# Patient Record
Sex: Female | Born: 1960 | Race: Black or African American | Hispanic: No | Marital: Single | Smoking: Never smoker
Health system: Southern US, Community
[De-identification: ages and names within clinical notes are randomized; demographics above are authoritative.]

## PROBLEM LIST (undated history)

## (undated) DIAGNOSIS — H35039 Hypertensive retinopathy, unspecified eye: Secondary | ICD-10-CM

## (undated) DIAGNOSIS — H269 Unspecified cataract: Secondary | ICD-10-CM

## (undated) DIAGNOSIS — I10 Essential (primary) hypertension: Secondary | ICD-10-CM

## (undated) DIAGNOSIS — E11319 Type 2 diabetes mellitus with unspecified diabetic retinopathy without macular edema: Secondary | ICD-10-CM

## (undated) HISTORY — DX: Unspecified cataract: H26.9

## (undated) HISTORY — PX: TUBAL LIGATION: SHX77

## (undated) HISTORY — PX: EYE SURGERY: SHX253

## (undated) HISTORY — DX: Hypertensive retinopathy, unspecified eye: H35.039

## (undated) HISTORY — DX: Type 2 diabetes mellitus with unspecified diabetic retinopathy without macular edema: E11.319

---

## 1999-09-04 ENCOUNTER — Encounter: Payer: Self-pay | Admitting: Emergency Medicine

## 1999-09-04 ENCOUNTER — Emergency Department (HOSPITAL_COMMUNITY): Admission: EM | Admit: 1999-09-04 | Discharge: 1999-09-04 | Payer: Self-pay | Admitting: Emergency Medicine

## 1999-10-22 ENCOUNTER — Emergency Department (HOSPITAL_COMMUNITY): Admission: EM | Admit: 1999-10-22 | Discharge: 1999-10-22 | Payer: Self-pay | Admitting: Emergency Medicine

## 2000-04-05 ENCOUNTER — Ambulatory Visit (HOSPITAL_COMMUNITY): Admission: RE | Admit: 2000-04-05 | Discharge: 2000-04-05 | Payer: Self-pay | Admitting: *Deleted

## 2000-11-10 ENCOUNTER — Emergency Department (HOSPITAL_COMMUNITY): Admission: EM | Admit: 2000-11-10 | Discharge: 2000-11-10 | Payer: Self-pay | Admitting: Emergency Medicine

## 2001-12-15 ENCOUNTER — Encounter: Payer: Self-pay | Admitting: Emergency Medicine

## 2001-12-15 ENCOUNTER — Emergency Department (HOSPITAL_COMMUNITY): Admission: EM | Admit: 2001-12-15 | Discharge: 2001-12-15 | Payer: Self-pay | Admitting: Emergency Medicine

## 2002-04-24 ENCOUNTER — Ambulatory Visit (HOSPITAL_COMMUNITY): Admission: RE | Admit: 2002-04-24 | Discharge: 2002-04-24 | Payer: Self-pay | Admitting: Obstetrics

## 2002-04-24 ENCOUNTER — Encounter: Payer: Self-pay | Admitting: Obstetrics

## 2003-04-23 ENCOUNTER — Encounter: Payer: Self-pay | Admitting: Obstetrics

## 2003-04-23 ENCOUNTER — Ambulatory Visit (HOSPITAL_COMMUNITY): Admission: RE | Admit: 2003-04-23 | Discharge: 2003-04-23 | Payer: Self-pay | Admitting: Obstetrics

## 2004-05-16 ENCOUNTER — Ambulatory Visit (HOSPITAL_COMMUNITY): Admission: RE | Admit: 2004-05-16 | Discharge: 2004-05-16 | Payer: Self-pay | Admitting: Obstetrics

## 2005-01-15 ENCOUNTER — Emergency Department (HOSPITAL_COMMUNITY): Admission: EM | Admit: 2005-01-15 | Discharge: 2005-01-15 | Payer: Self-pay | Admitting: Emergency Medicine

## 2005-05-17 ENCOUNTER — Ambulatory Visit (HOSPITAL_COMMUNITY): Admission: RE | Admit: 2005-05-17 | Discharge: 2005-05-17 | Payer: Self-pay | Admitting: Obstetrics

## 2005-08-16 ENCOUNTER — Ambulatory Visit (HOSPITAL_COMMUNITY): Admission: RE | Admit: 2005-08-16 | Discharge: 2005-08-16 | Payer: Self-pay | Admitting: Internal Medicine

## 2006-01-14 ENCOUNTER — Emergency Department (HOSPITAL_COMMUNITY): Admission: EM | Admit: 2006-01-14 | Discharge: 2006-01-14 | Payer: Self-pay | Admitting: Emergency Medicine

## 2006-01-18 ENCOUNTER — Encounter: Admission: RE | Admit: 2006-01-18 | Discharge: 2006-01-18 | Payer: Self-pay | Admitting: Orthopedic Surgery

## 2006-05-18 ENCOUNTER — Ambulatory Visit (HOSPITAL_COMMUNITY): Admission: RE | Admit: 2006-05-18 | Discharge: 2006-05-18 | Payer: Self-pay | Admitting: Obstetrics

## 2007-05-20 ENCOUNTER — Ambulatory Visit (HOSPITAL_COMMUNITY): Admission: RE | Admit: 2007-05-20 | Discharge: 2007-05-20 | Payer: Self-pay | Admitting: Obstetrics

## 2008-06-09 ENCOUNTER — Ambulatory Visit (HOSPITAL_COMMUNITY): Admission: RE | Admit: 2008-06-09 | Discharge: 2008-06-09 | Payer: Self-pay | Admitting: Obstetrics

## 2008-11-14 ENCOUNTER — Emergency Department (HOSPITAL_COMMUNITY): Admission: EM | Admit: 2008-11-14 | Discharge: 2008-11-14 | Payer: Self-pay | Admitting: Emergency Medicine

## 2009-06-11 ENCOUNTER — Ambulatory Visit (HOSPITAL_COMMUNITY): Admission: RE | Admit: 2009-06-11 | Discharge: 2009-06-11 | Payer: Self-pay | Admitting: Obstetrics

## 2010-06-14 ENCOUNTER — Ambulatory Visit (HOSPITAL_COMMUNITY): Admission: RE | Admit: 2010-06-14 | Discharge: 2010-06-14 | Payer: Self-pay | Admitting: Internal Medicine

## 2011-02-24 NOTE — Op Note (Signed)
[facility] of [facility]  Patient:    Jenny Cook, Jenny Cook                    MRN: [id number] Proc. Date: 04/05/00 Adm. Date:  [id number] Attending:  Deniece Ree                           Operative Report  PREOPERATIVE DIAGNOSIS:       Multiparity, desires permanent sterilization.  POSTOPERATIVE DIAGNOSIS:      Multiparity, desires permanent sterilization.  OPERATION:                    Laparoscopic bilateral tubal cauterization with tubal resection.  SURGEON:                      Deniece Ree, M.D.  ANESTHESIA:                   General.  ESTIMATED BLOOD LOSS:         Less than 25 cc.  COMPLICATIONS:                None.  DISPOSITION:                  The patient tolerated the procedure well and returned to the recovery room in satisfactory condition.  DESCRIPTION OF PROCEDURE:     The patient was taken to the operating room, prepped and draped in the usual fashion for sterilization procedure.  A speculum was placed in the vagina, following which the anterior lip of the cervix was then grasped with a Christella Hartigan tenaculum.  A subumbilical incision was made, following which the Veress needle was introduced through this incision and approximately 4 L of carbon dioxide was then infused without difficulty. The laparoscopic trocar was then placed through this incision, following which the laparoscope was placed through the sleeve.  Visualization of the pelvic organs came into view.  There was a moderate amount of adhesions which were apparently from her previous cesarean sections.  The right tube was identified.  It was then cauterized approximately 5 mm proximal to the right cornu and cauterized approximately up 4 cm in length.  This was done likewise on the left side.  At this point, the cauterized segments of tube bilaterally were then cut in three locations.  Hemostasis remained present.  Sponge and needle count was correct x 2.  The carbon dioxide was  allowed to escape from the abdominal cavity without problems.  The incision was then closed, first with a deep interrupted stitch, followed by a subcuticular stitch using 4-0 Vicryl.  The procedure was terminated.  The patient tolerated the procedure well and returned to the recovery room in satisfactory condition.  The patient will be discharged when fully alert.  She has been instructed on the possible complications and care for the site of surgery.  She has been told to return to my office in four weeks for follow up evaluation or to call me prior to that time, should a problem arise. DD:  04/05/00 TD:  04/06/00 Job: [id number]

## 2011-02-24 NOTE — H&P (Signed)
[facility] of [facility]  Patient:    Jenny Cook, Jenny Cook                    MRN: [id number] Adm. Date:  [id number] Attending:  Deniece Ree                         History and Physical  HISTORY:                      The patient is a 50 year old multiparous female who is desirous of permanent sterilization.  The patient understands the different types of contraceptives available.  However, she has decided to proceed with permanent sterilization.  She understands that this procedure is intended to be permanent.  However, cannot be guaranteed.  All of her questions were answered.  The possible complicates were explained to the patient, who understands and accepts.  PAST MEDICAL HISTORY AND REVIEW OF SYSTEMS:        Significant in that the patient did have cesarean section x 2.  PHYSICAL EXAMINATION:  GENERAL:                      Well-developed, well-nourished obese female in no acute distress.  HEENT:                        Within normal limits.  NECK:                         Supple.  BREASTS:                      Without masses, tenderness or discharge.  LUNGS:                        Clear to auscultation and percussion.  HEART:                        Normal sinus rhythm without murmurs, rubs, or gallops.  ABDOMEN:                      Obese and benign.  EXTREMITIES:                  Within normal limits.  NEUROLOGIC:                   Within normal limits.  PELVIC:                       External genitalia, BUS within normal limits. the vagina is clear.  The uterus is normal size, shape and consistency.  The adnexa are benign.  DIAGNOSIS:                    Multiparity, desires permanent sterilization.  PLAN:                         Laparoscopic bilateral tubal cauterization with tubal resection. DD:  04/05/00 TD:  04/06/00 Job: [id number]

## 2011-06-05 ENCOUNTER — Other Ambulatory Visit (HOSPITAL_COMMUNITY): Payer: Self-pay | Admitting: Obstetrics

## 2011-06-05 DIAGNOSIS — Z1231 Encounter for screening mammogram for malignant neoplasm of breast: Secondary | ICD-10-CM

## 2011-06-16 ENCOUNTER — Ambulatory Visit (HOSPITAL_COMMUNITY)
Admission: RE | Admit: 2011-06-16 | Discharge: 2011-06-16 | Disposition: A | Discharge status: Home / Self Care | Payer: BC Managed Care – PPO | Source: Ambulatory Visit | Attending: Obstetrics | Admitting: Obstetrics

## 2011-06-16 DIAGNOSIS — Z1231 Encounter for screening mammogram for malignant neoplasm of breast: Secondary | ICD-10-CM

## 2011-12-14 ENCOUNTER — Encounter (HOSPITAL_COMMUNITY): Payer: Self-pay | Admitting: Emergency Medicine

## 2011-12-14 ENCOUNTER — Emergency Department (INDEPENDENT_AMBULATORY_CARE_PROVIDER_SITE_OTHER)
Admission: EM | Admit: 2011-12-14 | Discharge: 2011-12-14 | Disposition: A | Discharge status: Home / Self Care | Payer: Self-pay | Source: Home / Self Care

## 2011-12-14 DIAGNOSIS — S161XXA Strain of muscle, fascia and tendon at neck level, initial encounter: Secondary | ICD-10-CM

## 2011-12-14 DIAGNOSIS — S139XXA Sprain of joints and ligaments of unspecified parts of neck, initial encounter: Secondary | ICD-10-CM

## 2011-12-14 HISTORY — DX: Essential (primary) hypertension: I10

## 2011-12-14 MED ORDER — DICLOFENAC SODIUM 75 MG PO TBEC: 75.0000 mg | DELAYED_RELEASE_TABLET | Freq: Two times a day (BID) | ORAL | Status: DC

## 2011-12-14 MED ORDER — TRAMADOL HCL 50 MG PO TABS: 50.0000 mg | ORAL_TABLET | Freq: Four times a day (QID) | ORAL | Status: AC | PRN

## 2011-12-14 MED ORDER — METHOCARBAMOL 750 MG PO TABS: 750.0000 mg | ORAL_TABLET | Freq: Four times a day (QID) | ORAL | Status: AC
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
## 2011-12-14 NOTE — ED Notes (Signed)
Report to marie, rn
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
## 2011-12-14 NOTE — Discharge Instructions (Signed)
Stop taking Aleve. Warm compresses to neck and upper back area 3-4 times a day for 15-20 minutes, or more often as needed. If your symptoms are not improving next week followup with Dr. Chestine Spore. Return sooner if symptoms change or worsen.

## 2011-12-15 NOTE — ED Provider Notes (Signed)
Medical screening examination/treatment/procedure(s) were performed by non-physician practitioner and as supervising physician I was immediately available for consultation/collaboration.  Fusaye Wachtel M. MD   Daisee Centner M Abel Hageman, MD 12/15/11 0826 

## 2012-01-02 ENCOUNTER — Emergency Department (HOSPITAL_COMMUNITY): Payer: No Typology Code available for payment source

## 2012-01-02 ENCOUNTER — Emergency Department (HOSPITAL_COMMUNITY)
Admission: EM | Admit: 2012-01-02 | Discharge: 2012-01-02 | Disposition: A | Discharge status: Home / Self Care | Payer: No Typology Code available for payment source | Attending: Emergency Medicine | Admitting: Emergency Medicine

## 2012-01-02 ENCOUNTER — Encounter (HOSPITAL_COMMUNITY): Payer: Self-pay

## 2012-01-02 DIAGNOSIS — M542 Cervicalgia: Secondary | ICD-10-CM | POA: Insufficient documentation

## 2012-01-02 DIAGNOSIS — E119 Type 2 diabetes mellitus without complications: Secondary | ICD-10-CM | POA: Insufficient documentation

## 2012-01-02 DIAGNOSIS — S161XXA Strain of muscle, fascia and tendon at neck level, initial encounter: Secondary | ICD-10-CM

## 2012-01-02 DIAGNOSIS — I10 Essential (primary) hypertension: Secondary | ICD-10-CM | POA: Insufficient documentation

## 2012-01-02 DIAGNOSIS — M545 Low back pain, unspecified: Secondary | ICD-10-CM | POA: Insufficient documentation

## 2012-01-02 DIAGNOSIS — S139XXA Sprain of joints and ligaments of unspecified parts of neck, initial encounter: Secondary | ICD-10-CM | POA: Insufficient documentation

## 2012-01-02 DIAGNOSIS — M546 Pain in thoracic spine: Secondary | ICD-10-CM | POA: Insufficient documentation

## 2012-01-02 LAB — GLUCOSE, CAPILLARY: Glucose-Capillary: 229 mg/dL — ABNORMAL HIGH (ref 70–99)

## 2012-01-02 MED ORDER — OXYCODONE-ACETAMINOPHEN 5-325 MG PO TABS: 1.0000 | ORAL_TABLET | Freq: Four times a day (QID) | ORAL | Status: AC | PRN

## 2012-01-02 MED ORDER — CYCLOBENZAPRINE HCL 5 MG PO TABS: 5.0000 mg | ORAL_TABLET | Freq: Three times a day (TID) | ORAL | Status: AC | PRN

## 2012-01-02 MED ORDER — OXYCODONE-ACETAMINOPHEN 5-325 MG PO TABS
1.0000 | ORAL_TABLET | Freq: Once | ORAL | Status: AC
  Administered 2012-01-02: 1 via ORAL
  Filled 2012-01-02: qty 1
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
## 2012-01-02 NOTE — ED Notes (Signed)
Patient reports she thinks her blood sugar is dropping, CBG checked and was 229
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
## 2012-01-02 NOTE — Discharge Instructions (Signed)
Cervical Sprain  A cervical sprain is an injury in the neck in which the ligaments are stretched or torn. The ligaments are the tissues that hold the bones of the neck (vertebrae) in place. Cervical sprains can range from very mild to very severe. Most cervical sprains get better in 1 to 3 weeks, but it depends on the cause and extent of the injury. Severe cervical sprains can cause the neck vertebrae to be unstable. This can lead to damage of the spinal cord and can result in serious nervous system problems. Your caregiver will determine whether your cervical sprain is mild or severe.  CAUSES   Severe cervical sprains may be caused by:  · Contact sport injuries (football, rugby, wrestling, hockey, auto racing, gymnastics, diving, martial arts, boxing).  · Motor vehicle collisions.  · Whiplash injuries. This means the neck is forcefully whipped backward and forward.  · Falls.  Mild cervical sprains may be caused by:   · Awkward positions, such as cradling a telephone between your ear and shoulder.  · Sitting in a chair that does not offer proper support.  · Working at a poorly designed computer station.  · Activities that require looking up or down for long periods of time.  SYMPTOMS   · Pain, soreness, stiffness, or a burning sensation in the front, back, or sides of the neck. This discomfort may develop immediately after injury or it may develop slowly and not begin for 24 hours or more after an injury.  · Pain or tenderness directly in the middle of the back of the neck.  · Shoulder or upper back pain.  · Limited ability to move the neck.  · Headache.  · Dizziness.  · Weakness, numbness, or tingling in the hands or arms.  · Muscle spasms.  · Difficulty swallowing or chewing.  · Tenderness and swelling of the neck.  DIAGNOSIS   Most of the time, your caregiver can diagnose this problem by taking your history and doing a physical exam. Your caregiver will ask about any known problems, such as arthritis in the neck  or a previous neck injury. X-rays may be taken to find out if there are any other problems, such as problems with the bones of the neck. However, an X-ray often does not reveal the full extent of a cervical sprain. Other tests such as a computed tomography (CT) scan or magnetic resonance imaging (MRI) may be needed.  TREATMENT   Treatment depends on the severity of the cervical sprain. Mild sprains can be treated with rest, keeping the neck in place (immobilization), and pain medicines. Severe cervical sprains need immediate immobilization and an appointment with an orthopedist or neurosurgeon. Several treatment options are available to help with pain, muscle spasms, and other symptoms. Your caregiver may prescribe:  · Medicines, such as pain relievers, numbing medicines, or muscle relaxants.  · Physical therapy. This can include stretching exercises, strengthening exercises, and posture training. Exercises and improved posture can help stabilize the neck, strengthen muscles, and help stop symptoms from returning.  · A neck collar to be worn for short periods of time. Often, these collars are worn for comfort. However, certain collars may be worn to protect the neck and prevent further worsening of a serious cervical sprain.  HOME CARE INSTRUCTIONS   · Put ice on the injured area.  · Put ice in a plastic bag.  · Place a towel between your skin and the bag.  · Leave the ice on for 15   to 20 minutes, 3 to 4 times a day.  · Only take over-the-counter or prescription medicines for pain, discomfort, or fever as directed by your caregiver.  · Keep all follow-up appointments as directed by your caregiver.  · Keep all physical therapy appointments as directed by your caregiver.  · If a neck collar is prescribed, wear it as directed by your caregiver.  · Do not drive while wearing a neck collar.  · Make any needed adjustments to your work station to promote good posture.  · Avoid positions and activities that make your  symptoms worse.  · Warm up and stretch before being active to help prevent problems.  SEEK MEDICAL CARE IF:   · Your pain is not controlled with medicine.  · You are unable to decrease your pain medicine over time as planned.  · Your activity level is not improving as expected.  SEEK IMMEDIATE MEDICAL CARE IF:   · You develop any bleeding, stomach upset, or signs of an allergic reaction to your medicine.  · Your symptoms get worse.  · You develop new, unexplained symptoms.  · You have numbness, tingling, weakness, or paralysis in any part of your body.  MAKE SURE YOU:   · Understand these instructions.  · Will watch your condition.  · Will get help right away if you are not doing well or get worse.  Document Released: 07/23/2007 Document Revised: 09/14/2011 Document Reviewed: 06/28/2011  ExitCare® Patient Information ©2012 [employer].  Motor Vehicle Collision   It is common to have multiple bruises and sore muscles after a motor vehicle collision (MVC). These tend to feel worse for the first 24 hours. You may have the most stiffness and soreness over the first several hours. You may also feel worse when you wake up the first morning after your collision. After this point, you will usually begin to improve with each day. The speed of improvement often depends on the severity of the collision, the number of injuries, and the location and nature of these injuries.  HOME CARE INSTRUCTIONS   · Put ice on the injured area.  · Put ice in a plastic bag.  · Place a towel between your skin and the bag.  · Leave the ice on for 15 to 20 minutes, 3 to 4 times a day.  · Drink enough fluids to keep your urine clear or pale yellow. Do not drink alcohol.  · Take a warm shower or bath once or twice a day. This will increase blood flow to sore muscles.  · You may return to activities as directed by your caregiver. Be careful when lifting, as this may aggravate neck or back pain.  · Only take over-the-counter or prescription  medicines for pain, discomfort, or fever as directed by your caregiver. Do not use aspirin. This may increase bruising and bleeding.  SEEK IMMEDIATE MEDICAL CARE IF:  · You have numbness, tingling, or weakness in the arms or legs.  · You develop severe headaches not relieved with medicine.  · You have severe neck pain, especially tenderness in the middle of the back of your neck.  · You have changes in bowel or bladder control.  · There is increasing pain in any area of the body.  · You have shortness of breath, lightheadedness, dizziness, or fainting.  · You have chest pain.  · You feel sick to your stomach (nauseous), throw up (vomit), or sweat.  · You have increasing abdominal discomfort.  · There   is blood in your urine, stool, or vomit.  · You have pain in your shoulder (shoulder strap areas).  · You feel your symptoms are getting worse.  MAKE SURE YOU:   · Understand these instructions.  · Will watch your condition.  · Will get help right away if you are not doing well or get worse.  Document Released: 09/25/2005 Document Revised: 09/14/2011 Document Reviewed: 02/22/2011  ExitCare® Patient Information ©2012 [employer].

## 2012-07-01 ENCOUNTER — Other Ambulatory Visit (HOSPITAL_COMMUNITY): Payer: Self-pay | Admitting: Obstetrics

## 2012-07-01 DIAGNOSIS — Z1231 Encounter for screening mammogram for malignant neoplasm of breast: Secondary | ICD-10-CM

## 2012-07-16 ENCOUNTER — Ambulatory Visit (HOSPITAL_COMMUNITY)
Admission: RE | Admit: 2012-07-16 | Discharge: 2012-07-16 | Disposition: A | Discharge status: Home / Self Care | Payer: BC Managed Care – PPO | Source: Ambulatory Visit | Attending: Obstetrics | Admitting: Obstetrics

## 2012-07-16 DIAGNOSIS — Z1231 Encounter for screening mammogram for malignant neoplasm of breast: Secondary | ICD-10-CM | POA: Insufficient documentation

## 2012-07-18 ENCOUNTER — Other Ambulatory Visit: Payer: Self-pay | Admitting: Obstetrics

## 2012-07-18 DIAGNOSIS — R928 Other abnormal and inconclusive findings on diagnostic imaging of breast: Secondary | ICD-10-CM

## 2012-07-23 ENCOUNTER — Ambulatory Visit
Admission: RE | Admit: 2012-07-23 | Discharge: 2012-07-23 | Disposition: A | Discharge status: Home / Self Care | Payer: BC Managed Care – PPO | Source: Ambulatory Visit | Attending: Obstetrics | Admitting: Obstetrics

## 2012-07-23 DIAGNOSIS — R928 Other abnormal and inconclusive findings on diagnostic imaging of breast: Secondary | ICD-10-CM

## 2012-09-18 ENCOUNTER — Other Ambulatory Visit (HOSPITAL_COMMUNITY): Payer: Self-pay | Admitting: Obstetrics

## 2012-09-18 DIAGNOSIS — Z1273 Encounter for screening for malignant neoplasm of ovary: Secondary | ICD-10-CM

## 2012-09-26 ENCOUNTER — Ambulatory Visit (HOSPITAL_COMMUNITY)
Admission: RE | Admit: 2012-09-26 | Discharge: 2012-09-26 | Disposition: A | Discharge status: Home / Self Care | Payer: BC Managed Care – PPO | Source: Ambulatory Visit | Attending: Obstetrics | Admitting: Obstetrics

## 2012-09-26 DIAGNOSIS — E119 Type 2 diabetes mellitus without complications: Secondary | ICD-10-CM | POA: Insufficient documentation

## 2012-09-26 DIAGNOSIS — Z1273 Encounter for screening for malignant neoplasm of ovary: Secondary | ICD-10-CM

## 2012-09-26 DIAGNOSIS — N838 Other noninflammatory disorders of ovary, fallopian tube and broad ligament: Secondary | ICD-10-CM | POA: Insufficient documentation

## 2012-09-26 DIAGNOSIS — Z1289 Encounter for screening for malignant neoplasm of other sites: Secondary | ICD-10-CM | POA: Insufficient documentation

## 2012-09-26 DIAGNOSIS — N859 Noninflammatory disorder of uterus, unspecified: Secondary | ICD-10-CM | POA: Insufficient documentation

## 2012-09-26 DIAGNOSIS — Z01411 Encounter for gynecological examination (general) (routine) with abnormal findings: Secondary | ICD-10-CM

## 2012-09-26 DIAGNOSIS — I10 Essential (primary) hypertension: Secondary | ICD-10-CM | POA: Insufficient documentation

## 2012-09-26 DIAGNOSIS — D259 Leiomyoma of uterus, unspecified: Secondary | ICD-10-CM | POA: Insufficient documentation

## 2012-12-25 ENCOUNTER — Other Ambulatory Visit: Payer: Self-pay | Admitting: Obstetrics

## 2013-01-08 ENCOUNTER — Ambulatory Visit
Admission: RE | Admit: 2013-01-08 | Discharge: 2013-01-08 | Disposition: A | Discharge status: Home / Self Care | Payer: BC Managed Care – PPO | Source: Ambulatory Visit | Attending: Obstetrics | Admitting: Obstetrics

## 2013-01-08 DIAGNOSIS — N63 Unspecified lump in unspecified breast: Secondary | ICD-10-CM

## 2013-08-25 ENCOUNTER — Other Ambulatory Visit (HOSPITAL_COMMUNITY): Payer: Self-pay | Admitting: Obstetrics

## 2013-08-25 DIAGNOSIS — Z0000 Encounter for general adult medical examination without abnormal findings: Secondary | ICD-10-CM

## 2013-09-01 ENCOUNTER — Other Ambulatory Visit: Payer: Self-pay | Admitting: Obstetrics

## 2013-09-01 ENCOUNTER — Ambulatory Visit (HOSPITAL_COMMUNITY)
Admission: RE | Admit: 2013-09-01 | Discharge: 2013-09-01 | Disposition: A | Discharge status: Home / Self Care | Payer: BC Managed Care – PPO | Source: Ambulatory Visit | Attending: Obstetrics | Admitting: Obstetrics

## 2013-09-01 DIAGNOSIS — N6320 Unspecified lump in the left breast, unspecified quadrant: Secondary | ICD-10-CM

## 2013-09-01 DIAGNOSIS — Z0000 Encounter for general adult medical examination without abnormal findings: Secondary | ICD-10-CM

## 2013-09-15 ENCOUNTER — Ambulatory Visit
Admission: RE | Admit: 2013-09-15 | Discharge: 2013-09-15 | Disposition: A | Discharge status: Home / Self Care | Payer: BC Managed Care – PPO | Source: Ambulatory Visit | Attending: Obstetrics | Admitting: Obstetrics

## 2013-09-15 DIAGNOSIS — N6320 Unspecified lump in the left breast, unspecified quadrant: Secondary | ICD-10-CM

## 2013-09-26 ENCOUNTER — Emergency Department (HOSPITAL_COMMUNITY)
Admission: EM | Admit: 2013-09-26 | Discharge: 2013-09-26 | Disposition: A | Discharge status: Home / Self Care | Payer: BC Managed Care – PPO | Source: Home / Self Care

## 2013-09-26 ENCOUNTER — Encounter (HOSPITAL_COMMUNITY): Payer: Self-pay | Admitting: Emergency Medicine

## 2013-09-26 DIAGNOSIS — R079 Chest pain, unspecified: Secondary | ICD-10-CM

## 2013-09-26 DIAGNOSIS — R0781 Pleurodynia: Secondary | ICD-10-CM

## 2013-09-26 DIAGNOSIS — R05 Cough: Secondary | ICD-10-CM

## 2013-09-26 DIAGNOSIS — J069 Acute upper respiratory infection, unspecified: Secondary | ICD-10-CM

## 2013-09-26 MED ORDER — PHENYLEPHRINE-CHLORPHEN-DM 10-4-12.5 MG/5ML PO LIQD: 5.0000 mL | ORAL | Status: DC | PRN

## 2013-09-26 NOTE — ED Provider Notes (Signed)
Medical screening examination/treatment/procedure(s) were performed by resident physician or non-physician practitioner and as supervising physician I was immediately available for consultation/collaboration.   Barkley Bruns MD.   Linna Hoff, MD 09/26/13 [phone]

## 2013-09-26 NOTE — ED Notes (Signed)
C/o rib pain States does have cough  States OTC cough medication and two zithromax was taken on thursday

## 2013-09-26 NOTE — ED Provider Notes (Signed)
CSN: [id number]     Arrival date & time 09/26/13  4098 History   First MD Initiated Contact with Patient 09/26/13 1051     Chief Complaint  Patient presents with  . Chest Pain   (Consider location/radiation/quality/duration/timing/severity/associated sxs/prior Treatment) HPI Comments: No dyspnea, fever, chest pain or tachycardia. Relaxed posturing ; normal ,even respirations.  Patient is a 52 y.o. female presenting with URI.  URI Presenting symptoms: congestion, cough and rhinorrhea   Presenting symptoms: no ear pain, no facial pain, no fever and no sore throat   Severity:  Mild Onset quality:  Gradual Duration:  3 days Timing:  Constant Progression:  Unchanged Chronicity:  New Relieved by:  Nothing Ineffective treatments:  OTC medications Associated symptoms: sneezing   Associated symptoms: no arthralgias, no headaches, no myalgias, no neck pain, no sinus pain, no swollen glands and no wheezing   Risk factors: diabetes mellitus   Risk factors: no recent illness     Past Medical History  Diagnosis Date  . Hypertension   . Diabetes mellitus    Past Surgical History  Procedure Laterality Date  . Tubal ligation     History reviewed. No pertinent family history. History  Substance Use Topics  . Smoking status: Never Smoker   . Smokeless tobacco: Not on file  . Alcohol Use: No   OB History   Grav Para Term Preterm Abortions TAB SAB Ect Mult Living                 Review of Systems  Constitutional: Negative for fever, chills, diaphoresis and activity change.  HENT: Positive for congestion, rhinorrhea and sneezing. Negative for ear pain and sore throat.   Respiratory: Positive for cough. Negative for shortness of breath and wheezing.   Cardiovascular: Negative for chest pain.  Genitourinary: Negative.   Musculoskeletal: Negative for arthralgias, myalgias and neck pain.       L lateral lower most rib pain associated with cough only. Better today.  Skin: Negative for  color change and rash.  Neurological: Negative for tremors, numbness and headaches.    Allergies  Review of patient's allergies indicates no known allergies.  Home Medications   Current Outpatient Rx  Name  Route  Sig  Dispense  Refill  . amLODipine (NORVASC) 10 MG tablet   Oral   Take 10 mg by mouth every morning.         . Insulin Isophane & Regular (HUMULIN 70/30 [place])   Subcutaneous   Inject 30-60 Units into the skin 2 (two) times daily. Sliding scale as directed. Pt normally takes 40 units in the am & 60 units in the pm.         . latanoprost (XALATAN) 0.005 % ophthalmic solution   Both Eyes   Place 1 drop into both eyes at bedtime.         Marland Kitchen losartan (COZAAR) 100 MG tablet   Oral   Take 100 mg by mouth every morning.         Marland Kitchen Phenylephrine-Chlorphen-DM 07-13-11.5 MG/5ML LIQD   Oral   Take 5 mLs by mouth every 4 (four) hours as needed.   120 mL   0    BP 190/106  Pulse 95  Temp(Src) 98.2 F (36.8 C) (Oral)  Resp 18  SpO2 97%  LMP 08/23/2013 Physical Exam  Nursing note and vitals reviewed. Constitutional: She is oriented to person, place, and time. She appears well-developed and well-nourished. No distress.  HENT:  Mouth/Throat: No oropharyngeal  exudate.  TM's nl OP with clear PND adn minor erythema.  Eyes: Conjunctivae and EOM are normal.  Neck: Normal range of motion. Neck supple.  Cardiovascular: Normal rate, regular rhythm and normal heart sounds.   Pulmonary/Chest: Effort normal and breath sounds normal. No respiratory distress. She has no wheezes. She has no rales.  Minor tenderness L lower lateral costal margin.  Musculoskeletal: Normal range of motion. She exhibits no edema.  Lymphadenopathy:    She has no cervical adenopathy.  Neurological: She is alert and oriented to person, place, and time.  Skin: Skin is warm and dry. No rash noted.  Psychiatric: She has a normal mood and affect.    ED Course  Procedures (including critical care  time) Labs Review Labs Reviewed - No data to display Imaging Review No results found.    MDM   1. Cough   2. URI (upper respiratory infection)   3. Rib pain on left side     Norel CS Plenty of fluids F/U wit your PCP    Hayden Rasmussen, NP 09/26/13 1147

## 2014-02-18 ENCOUNTER — Ambulatory Visit (INDEPENDENT_AMBULATORY_CARE_PROVIDER_SITE_OTHER): Payer: BC Managed Care – PPO

## 2014-02-18 ENCOUNTER — Ambulatory Visit (INDEPENDENT_AMBULATORY_CARE_PROVIDER_SITE_OTHER): Payer: BC Managed Care – PPO | Admitting: Podiatry

## 2014-02-18 VITALS — BP 140/73 | HR 80 | Resp 16

## 2014-02-18 DIAGNOSIS — M7660 Achilles tendinitis, unspecified leg: Secondary | ICD-10-CM

## 2014-02-18 DIAGNOSIS — E1149 Type 2 diabetes mellitus with other diabetic neurological complication: Secondary | ICD-10-CM

## 2014-02-18 DIAGNOSIS — R52 Pain, unspecified: Secondary | ICD-10-CM

## 2014-02-18 DIAGNOSIS — M79609 Pain in unspecified limb: Secondary | ICD-10-CM

## 2014-02-18 DIAGNOSIS — B351 Tinea unguium: Secondary | ICD-10-CM

## 2014-02-18 MED ORDER — TRIAMCINOLONE ACETONIDE 10 MG/ML IJ SUSP
10.0000 mg | Freq: Once | INTRAMUSCULAR | Status: AC
  Administered 2014-02-18: 10 mg

## 2014-02-18 NOTE — Patient Instructions (Signed)

## 2014-02-18 NOTE — Progress Notes (Signed)
Subjective:     Patient ID: Jenny Cook, female   DOB: 06-06-1961, 53 y.o.   MRN: [id number]  HPI patient presents with pain in the back of both heels pain in her nailbeds 1-5 both feet and a lesion right foot. 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 Keratotic lesion sub-fourth metatarsal right and I noted that there is significant discomfort in the posterior medial aspect of the heel region right over left    Assessment:     Achilles tendinitis mycotic nail infection with pain in keratotic lesion right    Plan:     H&P and x-rays reviewed. Discussed careful injection of the right posterior heel explaining chances for rupture and changes to her blood sugar. Patient wants procedure and today I did a careful medial injection 3 mg dexamethasone Kenalog 5 mg Xylocaine and advised him reduced activity for several days. Debridement nailbeds 1-5 both feet noted bleeding noted and lesion on plantar aspect right foot reappoint 4 weeks for consideration of injection left foot

## 2014-02-18 NOTE — Progress Notes (Signed)
   Subjective:    Patient ID: Jenny Cook, female    DOB: [birth year], 53 y.o.   MRN: [id number]  HPI Pt presents with bilateral foot pain located on backs of heels/achilles area. She also c/o right plantar foot pain. Requested nail trim. Pain is managed with Rx pain medication and is effective. No treatment other than pain medication.   Review of Systems     Objective:   Physical Exam        Assessment & Plan:

## 2014-03-18 ENCOUNTER — Ambulatory Visit (INDEPENDENT_AMBULATORY_CARE_PROVIDER_SITE_OTHER): Payer: BC Managed Care – PPO | Admitting: Podiatry

## 2014-03-18 ENCOUNTER — Encounter: Payer: Self-pay | Admitting: Podiatry

## 2014-03-18 VITALS — BP 135/82 | HR 75 | Resp 16

## 2014-03-18 DIAGNOSIS — M7660 Achilles tendinitis, unspecified leg: Secondary | ICD-10-CM

## 2014-03-18 MED ORDER — TRIAMCINOLONE ACETONIDE 10 MG/ML IJ SUSP
10.0000 mg | Freq: Once | INTRAMUSCULAR | Status: AC
  Administered 2014-03-18: 10 mg

## 2014-03-18 NOTE — Progress Notes (Signed)
Subjective:     Patient ID: Jenny Cook, female   DOB: 11-17-1960, 53 y.o.   MRN: [id number]  HPI patient presents stating my left heel is really hurting but the right one has gotten so much better   Review of Systems     Objective:   Physical Exam Neurovascular status intact with pain in the posterior left heel at the insertion lateral side with inflammation    Assessment:     Improved Achilles tendinitis right present left    Plan:     Gave instructions on physical therapy for the right and for the left went ahead and recommended injection explaining risk of rupture. She wants procedure and I carefully injected the lateral side 3 mg Kenalog dexamethasone 5 mg Xylocaine and instructed on reduced activity. Reappoint her recheck if symptoms indicate

## 2014-03-18 NOTE — Progress Notes (Signed)
   Subjective:    Patient ID: Jenny Cook, female    DOB: 1960-12-02, 53 y.o.   MRN: [id number]  HPI Pt noted improvement in pain to right achilles. Left foot achilles tendon pain has worsened. Standing for long periods of time brings on and worsens pain. Has had pain in both heels for 2 years off and on   Review of Systems     Objective:   Physical Exam        Assessment & Plan:

## 2014-09-17 ENCOUNTER — Other Ambulatory Visit (HOSPITAL_COMMUNITY): Payer: Self-pay | Admitting: Obstetrics

## 2014-09-17 ENCOUNTER — Other Ambulatory Visit: Payer: Self-pay | Admitting: Obstetrics

## 2014-09-17 DIAGNOSIS — N6320 Unspecified lump in the left breast, unspecified quadrant: Secondary | ICD-10-CM

## 2014-09-17 DIAGNOSIS — Z1231 Encounter for screening mammogram for malignant neoplasm of breast: Secondary | ICD-10-CM

## 2014-09-30 ENCOUNTER — Ambulatory Visit
Admission: RE | Admit: 2014-09-30 | Discharge: 2014-09-30 | Disposition: A | Discharge status: Home / Self Care | Payer: BC Managed Care – PPO | Source: Ambulatory Visit | Attending: Obstetrics | Admitting: Obstetrics

## 2014-09-30 DIAGNOSIS — N6320 Unspecified lump in the left breast, unspecified quadrant: Secondary | ICD-10-CM

## 2014-10-13 LAB — PROCEDURE REPORT - SCANNED: PAP SMEAR: NEGATIVE

## 2014-12-29 ENCOUNTER — Encounter: Payer: Self-pay | Admitting: Podiatry

## 2014-12-29 ENCOUNTER — Ambulatory Visit (INDEPENDENT_AMBULATORY_CARE_PROVIDER_SITE_OTHER): Payer: BLUE CROSS/BLUE SHIELD | Admitting: Podiatry

## 2014-12-29 VITALS — BP 118/67 | HR 96 | Resp 18

## 2014-12-29 DIAGNOSIS — R52 Pain, unspecified: Secondary | ICD-10-CM | POA: Diagnosis not present

## 2014-12-29 DIAGNOSIS — E1049 Type 1 diabetes mellitus with other diabetic neurological complication: Secondary | ICD-10-CM

## 2014-12-29 DIAGNOSIS — Q828 Other specified congenital malformations of skin: Secondary | ICD-10-CM

## 2014-12-29 DIAGNOSIS — E1151 Type 2 diabetes mellitus with diabetic peripheral angiopathy without gangrene: Secondary | ICD-10-CM

## 2014-12-29 NOTE — Progress Notes (Signed)
Subjective:     Patient ID: Jenny Cook, female   DOB: April 06, 1961, 54 y.o.   MRN: [id number]  HPI alternative diabetic who presents with significant lesions around the fifth metatarsal of both feet the become painful and long-term utilization of diabetic shoes which have been beneficial to her   Review of Systems     Objective:   Physical Exam Neurovascular status found to be diminished but no change from previous visits with keratotic lesion sub-5 both feet and moderate flatfoot deformity with pre-ulcerated of-like appearance to the fifth MPJ of both feet    Assessment:     At risk diabetic with lesion formation secondary to foot structure and long-term diabetes    Plan:     Reviewed condition and at this time recommended debridement which was accomplished along with long-term diabetic shoes which will be casted today. Reappoint when they are ready or if any issues should occur and did educate her on diabetic foot

## 2015-03-03 ENCOUNTER — Ambulatory Visit (INDEPENDENT_AMBULATORY_CARE_PROVIDER_SITE_OTHER): Payer: BLUE CROSS/BLUE SHIELD | Admitting: Podiatrist

## 2015-03-03 ENCOUNTER — Ambulatory Visit: Payer: BLUE CROSS/BLUE SHIELD

## 2015-03-03 DIAGNOSIS — M2142 Flat foot [pes planus] (acquired), left foot: Secondary | ICD-10-CM

## 2015-03-03 DIAGNOSIS — E1140 Type 2 diabetes mellitus with diabetic neuropathy, unspecified: Secondary | ICD-10-CM

## 2015-03-03 DIAGNOSIS — M2141 Flat foot [pes planus] (acquired), right foot: Secondary | ICD-10-CM | POA: Diagnosis not present

## 2015-03-03 DIAGNOSIS — E1151 Type 2 diabetes mellitus with diabetic peripheral angiopathy without gangrene: Secondary | ICD-10-CM

## 2015-03-03 DIAGNOSIS — E1149 Type 2 diabetes mellitus with other diabetic neurological complication: Secondary | ICD-10-CM

## 2015-03-03 DIAGNOSIS — Q828 Other specified congenital malformations of skin: Secondary | ICD-10-CM

## 2015-03-03 NOTE — Progress Notes (Signed)
Patient ID: MI BALLA, female   DOB: 22-Apr-1961, 54 y.o.   MRN: [id number] PATIENT PRESENTS FOR DIABETIC SHOE PICK UP, SHOES ARE TRIED ON FOR GOOD FIT.  PATIENT RECEIVED 1 PAIR APEX CLASSIC [employer] STRAP B3000W WOMEN'S SIZE 10.5 WIDE AND 3 PAIRS CUSTOM DIABETIC INSERTS.  WRITTEN BREAK IN AND WEAR INSTRUCTIONS GIVEN

## 2015-03-03 NOTE — Patient Instructions (Signed)

## 2016-07-05 ENCOUNTER — Encounter: Payer: Self-pay | Admitting: *Deleted

## 2016-08-07 ENCOUNTER — Other Ambulatory Visit: Payer: Self-pay | Admitting: Internal Medicine

## 2016-08-07 DIAGNOSIS — Z1231 Encounter for screening mammogram for malignant neoplasm of breast: Secondary | ICD-10-CM

## 2016-08-18 ENCOUNTER — Ambulatory Visit
Admission: RE | Admit: 2016-08-18 | Discharge: 2016-08-18 | Disposition: A | Discharge status: Home / Self Care | Payer: Self-pay | Source: Ambulatory Visit | Attending: Internal Medicine | Admitting: Internal Medicine

## 2016-08-18 DIAGNOSIS — Z1231 Encounter for screening mammogram for malignant neoplasm of breast: Secondary | ICD-10-CM

## 2016-08-25 ENCOUNTER — Ambulatory Visit (INDEPENDENT_AMBULATORY_CARE_PROVIDER_SITE_OTHER): Payer: Self-pay

## 2016-08-25 ENCOUNTER — Ambulatory Visit (INDEPENDENT_AMBULATORY_CARE_PROVIDER_SITE_OTHER): Payer: BLUE CROSS/BLUE SHIELD | Admitting: Orthopaedic Surgery

## 2016-08-25 ENCOUNTER — Encounter (INDEPENDENT_AMBULATORY_CARE_PROVIDER_SITE_OTHER): Payer: Self-pay | Admitting: Orthopaedic Surgery

## 2016-08-25 DIAGNOSIS — M1711 Unilateral primary osteoarthritis, right knee: Secondary | ICD-10-CM | POA: Diagnosis not present

## 2016-08-25 DIAGNOSIS — M25561 Pain in right knee: Secondary | ICD-10-CM

## 2016-08-25 DIAGNOSIS — G8929 Other chronic pain: Secondary | ICD-10-CM | POA: Insufficient documentation

## 2016-08-25 NOTE — Patient Instructions (Signed)

## 2016-08-25 NOTE — Progress Notes (Signed)
Office Visit Note   Patient: Jenny Cook           Date of Birth: 1961-04-11           MRN: [id number] Visit Date: 08/25/2016              Requested by: Foye Spurling, MD [address] [place], [place] [address] PCP: Foye Spurling, MD   Assessment & Plan: Visit Diagnoses:  1. Chronic pain of right knee   2. Unilateral primary osteoarthritis, right knee     Plan: Right knee Depo-Medrol injection was performed today under sterile conditions patient tolerated this well. We'll see her back as needed.  Follow-Up Instructions: No Follow-up on file.   Orders:  Orders Placed This Encounter  Procedures  . XR KNEE 3 VIEW RIGHT   No orders of the defined types were placed in this encounter.     Procedures: Large Joint Inj Date/Time: 08/25/2016 4:54 PM Performed by: Leandrew Koyanagi Authorized by: Leandrew Koyanagi   Consent Given by:  Patient Timeout: prior to procedure the correct patient, procedure, and site was verified   Indications:  Pain Location:  Knee Site:  R knee Prep: patient was prepped and draped in usual sterile fashion   Needle Size:  22 G Approach:  Anterolateral Ultrasound Guidance: No   Fluoroscopic Guidance: No       Clinical Data: No additional findings.   Subjective: Chief Complaint  Patient presents with  . Right Knee - Pain    Patient is a 55 year old female who comes in with worsening right knee pain for the last 3-4 days. She does have chronic knee pain with known degenerative joint disease. She's had good response to cortisone injections in the past. She would like to have another injection today. He feels like there is fluid on the knee. She denies any constitutional symptoms. Denies any mechanical symptoms. Pain is worse with weightbearing and standing.    Review of Systems  Constitutional: Negative.   HENT: Negative.   Eyes: Negative.   Respiratory: Negative.   Cardiovascular: Negative.   Endocrine: Negative.    Musculoskeletal: Negative.   Neurological: Negative.   Hematological: Negative.   Psychiatric/Behavioral: Negative.   All other systems reviewed and are negative.    Objective: Vital Signs: There were no vitals taken for this visit.  Physical Exam  Constitutional: She is oriented to person, place, and time. She appears well-developed and well-nourished.  HENT:  Head: Atraumatic.  Eyes: EOM are normal.  Neck: Neck supple.  Cardiovascular: Intact distal pulses.   Pulmonary/Chest: Effort normal.  Abdominal: Soft.  Neurological: She is alert and oriented to person, place, and time.  Skin: Skin is warm. Capillary refill takes less than 2 seconds.  Psychiatric: She has a normal mood and affect. Her behavior is normal. Judgment and thought content normal.  Nursing note and vitals reviewed.   Ortho Exam Exam of the right knee shows trace effusion. She has good range of motion. Collaterals and cruciates are stable. She does have medial joint line tenderness. Specialty Comments:  No specialty comments available.  Imaging: Xr Knee 3 View Right  Result Date: 08/25/2016 Advanced arthritis and degenerative joint disease of bilateral knees.    PMFS History: Patient Active Problem List   Diagnosis Date Noted  . Unilateral primary osteoarthritis, right knee 08/25/2016  . Chronic pain of right knee 08/25/2016   Past Medical History:  Diagnosis Date  . Diabetes mellitus   .  Hypertension     No family history on file.  Past Surgical History:  Procedure Laterality Date  . TUBAL LIGATION     Social History   Occupational History  . Not on file.   Social History Main Topics  . Smoking status: Never Smoker  . Smokeless tobacco: Not on file  . Alcohol use No  . Drug use: No  . Sexual activity: Not on file

## 2016-08-30 ENCOUNTER — Ambulatory Visit (INDEPENDENT_AMBULATORY_CARE_PROVIDER_SITE_OTHER): Payer: BLUE CROSS/BLUE SHIELD | Admitting: Orthopedic Surgery

## 2017-05-25 ENCOUNTER — Ambulatory Visit (INDEPENDENT_AMBULATORY_CARE_PROVIDER_SITE_OTHER): Payer: BLUE CROSS/BLUE SHIELD | Admitting: Podiatry

## 2017-05-25 ENCOUNTER — Encounter: Payer: Self-pay | Admitting: Podiatry

## 2017-05-25 VITALS — BP 139/76 | HR 79 | Resp 16

## 2017-05-25 DIAGNOSIS — E1149 Type 2 diabetes mellitus with other diabetic neurological complication: Secondary | ICD-10-CM | POA: Diagnosis not present

## 2017-05-25 DIAGNOSIS — Q828 Other specified congenital malformations of skin: Secondary | ICD-10-CM

## 2017-05-25 NOTE — Progress Notes (Signed)
Subjective:    Patient ID: Jenny Cook, female   DOB: 56 y.o.   MRN: [id number]   HPI patient is long-term diabetic with diminished neurological sensation Has significant lesion formation plantar fifth metatarsal right over left fifth digit bilateral with diminished sharp dull and vibratory    ROS      Objective:  Physical Exam vascular status was found to be diminished but intact with patient noted to have significant lesion with lucent core sub-fifth metatarsal right distal aspect fifth digit right and fifth metatarsal left. Patient's also noted to have diminished sharp dull and vibratory     Assessment:    At risk patient long-term diabetic with lesion formation bilateral     Plan:    H&P condition reviewed and discussed long-term diabetic shoes which we'll try to get approval for this patient is a think they will reduce her risk factors. Today sharp debridement of all lesions occur did apply a bandage the fifth digit right with a small amount of bleeding with Neosporin instructed on soaks and if any redness were to occur she is to see me. Patient will be seen back for regular visit

## 2017-05-30 ENCOUNTER — Telehealth: Payer: Self-pay | Admitting: Podiatry

## 2017-05-30 NOTE — Telephone Encounter (Signed)
Left message for pt to call to discuss diabetic shoe/ insert benefits

## 2017-08-07 ENCOUNTER — Other Ambulatory Visit: Payer: Self-pay | Admitting: Internal Medicine

## 2017-08-07 DIAGNOSIS — Z1231 Encounter for screening mammogram for malignant neoplasm of breast: Secondary | ICD-10-CM

## 2017-08-23 ENCOUNTER — Encounter (INDEPENDENT_AMBULATORY_CARE_PROVIDER_SITE_OTHER): Payer: BLUE CROSS/BLUE SHIELD | Admitting: Ophthalmology

## 2017-08-23 DIAGNOSIS — H43812 Vitreous degeneration, left eye: Secondary | ICD-10-CM | POA: Diagnosis not present

## 2017-08-23 DIAGNOSIS — E103513 Type 1 diabetes mellitus with proliferative diabetic retinopathy with macular edema, bilateral: Secondary | ICD-10-CM

## 2017-08-23 DIAGNOSIS — H2513 Age-related nuclear cataract, bilateral: Secondary | ICD-10-CM | POA: Diagnosis not present

## 2017-08-23 DIAGNOSIS — H40053 Ocular hypertension, bilateral: Secondary | ICD-10-CM | POA: Diagnosis not present

## 2017-08-23 DIAGNOSIS — H4312 Vitreous hemorrhage, left eye: Secondary | ICD-10-CM | POA: Diagnosis not present

## 2017-08-23 DIAGNOSIS — H35373 Puckering of macula, bilateral: Secondary | ICD-10-CM | POA: Diagnosis not present

## 2017-08-23 DIAGNOSIS — H43813 Vitreous degeneration, bilateral: Secondary | ICD-10-CM

## 2017-08-23 DIAGNOSIS — I10 Essential (primary) hypertension: Secondary | ICD-10-CM | POA: Diagnosis not present

## 2017-08-23 DIAGNOSIS — E10311 Type 1 diabetes mellitus with unspecified diabetic retinopathy with macular edema: Secondary | ICD-10-CM

## 2017-08-23 DIAGNOSIS — H25813 Combined forms of age-related cataract, bilateral: Secondary | ICD-10-CM | POA: Diagnosis not present

## 2017-08-23 DIAGNOSIS — H35033 Hypertensive retinopathy, bilateral: Secondary | ICD-10-CM

## 2017-08-27 DIAGNOSIS — I10 Essential (primary) hypertension: Secondary | ICD-10-CM | POA: Diagnosis not present

## 2017-08-27 DIAGNOSIS — E119 Type 2 diabetes mellitus without complications: Secondary | ICD-10-CM | POA: Diagnosis not present

## 2017-08-27 DIAGNOSIS — M2550 Pain in unspecified joint: Secondary | ICD-10-CM | POA: Diagnosis not present

## 2017-08-27 DIAGNOSIS — E559 Vitamin D deficiency, unspecified: Secondary | ICD-10-CM | POA: Diagnosis not present

## 2017-08-29 ENCOUNTER — Ambulatory Visit (INDEPENDENT_AMBULATORY_CARE_PROVIDER_SITE_OTHER): Payer: BLUE CROSS/BLUE SHIELD | Admitting: Podiatry

## 2017-08-29 ENCOUNTER — Encounter: Payer: Self-pay | Admitting: Podiatry

## 2017-08-29 DIAGNOSIS — B351 Tinea unguium: Secondary | ICD-10-CM | POA: Diagnosis not present

## 2017-08-29 DIAGNOSIS — E1149 Type 2 diabetes mellitus with other diabetic neurological complication: Secondary | ICD-10-CM

## 2017-08-29 DIAGNOSIS — M79675 Pain in left toe(s): Secondary | ICD-10-CM | POA: Diagnosis not present

## 2017-08-29 DIAGNOSIS — M79674 Pain in right toe(s): Secondary | ICD-10-CM

## 2017-08-29 DIAGNOSIS — Q828 Other specified congenital malformations of skin: Secondary | ICD-10-CM | POA: Diagnosis not present

## 2017-09-06 ENCOUNTER — Encounter (INDEPENDENT_AMBULATORY_CARE_PROVIDER_SITE_OTHER): Payer: BLUE CROSS/BLUE SHIELD | Admitting: Ophthalmology

## 2017-09-06 DIAGNOSIS — E11311 Type 2 diabetes mellitus with unspecified diabetic retinopathy with macular edema: Secondary | ICD-10-CM

## 2017-09-06 DIAGNOSIS — E113511 Type 2 diabetes mellitus with proliferative diabetic retinopathy with macular edema, right eye: Secondary | ICD-10-CM

## 2017-09-10 ENCOUNTER — Ambulatory Visit
Admission: RE | Admit: 2017-09-10 | Discharge: 2017-09-10 | Disposition: A | Discharge status: Home / Self Care | Payer: BLUE CROSS/BLUE SHIELD | Source: Ambulatory Visit | Attending: Internal Medicine | Admitting: Internal Medicine

## 2017-09-10 DIAGNOSIS — Z1231 Encounter for screening mammogram for malignant neoplasm of breast: Secondary | ICD-10-CM

## 2017-11-26 ENCOUNTER — Ambulatory Visit: Payer: BLUE CROSS/BLUE SHIELD | Admitting: Podiatry

## 2017-11-30 ENCOUNTER — Encounter: Payer: Self-pay | Admitting: Podiatry

## 2017-11-30 ENCOUNTER — Ambulatory Visit (INDEPENDENT_AMBULATORY_CARE_PROVIDER_SITE_OTHER): Payer: BLUE CROSS/BLUE SHIELD | Admitting: Podiatry

## 2017-11-30 DIAGNOSIS — M79674 Pain in right toe(s): Secondary | ICD-10-CM | POA: Diagnosis not present

## 2017-11-30 DIAGNOSIS — E1140 Type 2 diabetes mellitus with diabetic neuropathy, unspecified: Secondary | ICD-10-CM | POA: Diagnosis not present

## 2017-11-30 DIAGNOSIS — B351 Tinea unguium: Secondary | ICD-10-CM | POA: Diagnosis not present

## 2017-11-30 DIAGNOSIS — M79675 Pain in left toe(s): Secondary | ICD-10-CM | POA: Diagnosis not present

## 2017-11-30 DIAGNOSIS — Q828 Other specified congenital malformations of skin: Secondary | ICD-10-CM

## 2017-11-30 DIAGNOSIS — M79671 Pain in right foot: Secondary | ICD-10-CM

## 2017-11-30 DIAGNOSIS — E1149 Type 2 diabetes mellitus with other diabetic neurological complication: Secondary | ICD-10-CM

## 2017-12-02 NOTE — Progress Notes (Signed)
Subjective:   Patient ID: Jenny Cook, female   DOB: 57 y.o.   MRN: [id number]   HPI Patient presents stating I have chronic lesion bottom the left that I cannot take care of and nail disease 1-5 both feet that are bothersome to make it hard to wear shoe gear comfortably.  Patient is a long-term diabetic who is in fair control with an A1c of 9   ROS      Objective:  Physical Exam  No change neurovascular status with patient found to have thick callus tissue plantar aspect left first metatarsal that is painful and incurvated nailbeds 1-5 both feet that are thick yellow brittle and are painful with patient not able to cut     Assessment:  At risk diabetic with mycotic nail infection 1-5 both feet with pain in the plantar left first metatarsal with keratotic tissue formation     Plan:  H&P diabetic foot care discussed and control discussed with patient.  Debrided lesion and debrided nailbeds 1-5 both feet with no iatrogenic bleeding noted and reappoint for routine care

## 2017-12-11 DIAGNOSIS — Z0001 Encounter for general adult medical examination with abnormal findings: Secondary | ICD-10-CM | POA: Diagnosis not present

## 2017-12-11 DIAGNOSIS — Z01419 Encounter for gynecological examination (general) (routine) without abnormal findings: Secondary | ICD-10-CM | POA: Diagnosis not present

## 2017-12-11 DIAGNOSIS — I10 Essential (primary) hypertension: Secondary | ICD-10-CM | POA: Insufficient documentation

## 2017-12-11 DIAGNOSIS — E119 Type 2 diabetes mellitus without complications: Secondary | ICD-10-CM | POA: Insufficient documentation

## 2017-12-11 DIAGNOSIS — Z136 Encounter for screening for cardiovascular disorders: Secondary | ICD-10-CM | POA: Diagnosis not present

## 2017-12-11 DIAGNOSIS — Z23 Encounter for immunization: Secondary | ICD-10-CM | POA: Diagnosis not present

## 2017-12-17 DIAGNOSIS — Z136 Encounter for screening for cardiovascular disorders: Secondary | ICD-10-CM | POA: Diagnosis not present

## 2017-12-17 DIAGNOSIS — Z0001 Encounter for general adult medical examination with abnormal findings: Secondary | ICD-10-CM | POA: Diagnosis not present

## 2017-12-17 DIAGNOSIS — E1165 Type 2 diabetes mellitus with hyperglycemia: Secondary | ICD-10-CM | POA: Diagnosis not present

## 2017-12-20 DIAGNOSIS — E782 Mixed hyperlipidemia: Secondary | ICD-10-CM | POA: Insufficient documentation

## 2018-01-07 ENCOUNTER — Encounter (INDEPENDENT_AMBULATORY_CARE_PROVIDER_SITE_OTHER): Payer: BLUE CROSS/BLUE SHIELD | Admitting: Ophthalmology

## 2018-01-07 DIAGNOSIS — E113593 Type 2 diabetes mellitus with proliferative diabetic retinopathy without macular edema, bilateral: Secondary | ICD-10-CM | POA: Diagnosis not present

## 2018-01-07 DIAGNOSIS — H43813 Vitreous degeneration, bilateral: Secondary | ICD-10-CM | POA: Diagnosis not present

## 2018-01-07 DIAGNOSIS — I10 Essential (primary) hypertension: Secondary | ICD-10-CM | POA: Diagnosis not present

## 2018-01-07 DIAGNOSIS — H4312 Vitreous hemorrhage, left eye: Secondary | ICD-10-CM | POA: Diagnosis not present

## 2018-01-07 DIAGNOSIS — H35033 Hypertensive retinopathy, bilateral: Secondary | ICD-10-CM

## 2018-01-07 DIAGNOSIS — E11319 Type 2 diabetes mellitus with unspecified diabetic retinopathy without macular edema: Secondary | ICD-10-CM | POA: Diagnosis not present

## 2018-01-08 DIAGNOSIS — E1165 Type 2 diabetes mellitus with hyperglycemia: Secondary | ICD-10-CM | POA: Insufficient documentation

## 2018-01-08 DIAGNOSIS — R062 Wheezing: Secondary | ICD-10-CM | POA: Diagnosis not present

## 2018-01-08 DIAGNOSIS — IMO0002 Reserved for concepts with insufficient information to code with codable children: Secondary | ICD-10-CM | POA: Insufficient documentation

## 2018-01-08 DIAGNOSIS — I10 Essential (primary) hypertension: Secondary | ICD-10-CM | POA: Diagnosis not present

## 2018-01-08 DIAGNOSIS — E782 Mixed hyperlipidemia: Secondary | ICD-10-CM | POA: Diagnosis not present

## 2018-01-28 DIAGNOSIS — E782 Mixed hyperlipidemia: Secondary | ICD-10-CM | POA: Diagnosis not present

## 2018-01-28 DIAGNOSIS — E1165 Type 2 diabetes mellitus with hyperglycemia: Secondary | ICD-10-CM | POA: Diagnosis not present

## 2018-02-11 ENCOUNTER — Encounter (INDEPENDENT_AMBULATORY_CARE_PROVIDER_SITE_OTHER): Payer: Self-pay | Admitting: Ophthalmology

## 2018-02-11 ENCOUNTER — Ambulatory Visit (INDEPENDENT_AMBULATORY_CARE_PROVIDER_SITE_OTHER): Payer: BLUE CROSS/BLUE SHIELD | Admitting: Ophthalmology

## 2018-02-11 DIAGNOSIS — H04123 Dry eye syndrome of bilateral lacrimal glands: Secondary | ICD-10-CM | POA: Diagnosis not present

## 2018-02-11 DIAGNOSIS — H4313 Vitreous hemorrhage, bilateral: Secondary | ICD-10-CM | POA: Diagnosis not present

## 2018-02-11 DIAGNOSIS — E113593 Type 2 diabetes mellitus with proliferative diabetic retinopathy without macular edema, bilateral: Secondary | ICD-10-CM

## 2018-02-11 DIAGNOSIS — H25813 Combined forms of age-related cataract, bilateral: Secondary | ICD-10-CM | POA: Diagnosis not present

## 2018-02-11 DIAGNOSIS — H35033 Hypertensive retinopathy, bilateral: Secondary | ICD-10-CM | POA: Diagnosis not present

## 2018-02-11 DIAGNOSIS — H3581 Retinal edema: Secondary | ICD-10-CM | POA: Diagnosis not present

## 2018-02-11 DIAGNOSIS — H40053 Ocular hypertension, bilateral: Secondary | ICD-10-CM | POA: Diagnosis not present

## 2018-02-11 DIAGNOSIS — I10 Essential (primary) hypertension: Secondary | ICD-10-CM

## 2018-02-11 DIAGNOSIS — H4311 Vitreous hemorrhage, right eye: Secondary | ICD-10-CM | POA: Diagnosis not present

## 2018-02-11 DIAGNOSIS — H4312 Vitreous hemorrhage, left eye: Secondary | ICD-10-CM | POA: Diagnosis not present

## 2018-02-11 NOTE — Progress Notes (Signed)
[facility] Note  02/11/2018     CHIEF COMPLAINT Patient presents for Retina Evaluation and Diabetic Eye Exam   HISTORY OF PRESENT ILLNESS: Jenny Cook is a 57 y.o. female who presents to the clinic today for:   HPI    Retina Evaluation    In both eyes.  Associated Symptoms Floaters.  Negative for Flashes, Pain, Fever, Trauma, Redness, Scalp Tenderness, Weight Loss, Fatigue, Jaw Claudication, Photophobia, Distortion, Blind Spot, Glare and Shoulder/Hip pain.  Context:  distance vision, mid-range vision and near vision.  Treatments tried include laser.  Response to treatment was no improvement.  I, the attending physician,  performed the HPI with the patient and updated documentation appropriately.          Diabetic Eye Exam    Vision is stable.  Associated Symptoms Negative for Blind Spot, Glare, Shoulder/Hip pain, Fatigue, Jaw Claudication, Photophobia, Distortion, Floaters, Redness, Scalp Tenderness, Weight Loss, Fever, Trauma, Pain and Flashes.  Diabetes characteristics include Type 2 and on insulin.  This started 30 years ago.  Blood sugar level fluctuates.  I, the attending physician,  performed the HPI with the patient and updated documentation appropriately.          Comments    Previous patient of Dr. Zigmund Daniel, seen by Dr. Katy Fitch today for vitreous hem. Patient states she noticed appx 4 days ago her vision was becoming cloudy OD as time went she "couldn't see" OD. Pt reports she has constant floaters"they may go away for appx I hr then they return"', she has sun light sensitivity, she wears sun glasses for protection. Pt  Is DM2 , onset appx 30 yrs ago , controlled by insulin, her BS fluctuates , denies visual changes with BS. Bs 179 this am, A1C 8.11 (12/2017) Pt is using Latanoprost eye gtt's QHS,        Last edited by Bernarda Caffey, MD on 02/11/2018  3:26 PM. (History)      Referring physician: Clent Jacks, MD [address] [place], [place] [address]  HISTORICAL INFORMATION:   Selected notes from the MEDICAL RECORD NUMBER Referred from Dr. Elliot Dally for concern of VH OU;   CURRENT MEDICATIONS: Current Outpatient Medications (Ophthalmic Drugs)  Medication Sig  . latanoprost (XALATAN) 0.005 % ophthalmic solution Place 1 drop into both eyes at bedtime.  Marland Kitchen LATANOPROST OP latanoprost 0.005 % eye drops   No current facility-administered medications for this visit.  (Ophthalmic Drugs)   Current Outpatient Medications (Other)  Medication Sig  . amLODipine (NORVASC) 10 MG tablet Take 10 mg by mouth every morning.  . insulin NPH-regular Human (NOVOLIN 70/30) (70-30) 100 UNIT/ML injection Novolin 70/30 U-100 Insulin  take 50u am/60u pm  . irbesartan-hydrochlorothiazide (AVALIDE) 150-12.5 MG tablet   . NOVOLOG MIX 70/30 (70-30) 100 UNIT/ML injection    No current facility-administered medications for this visit.  (Other)      REVIEW OF SYSTEMS: ROS    Positive for: Genitourinary, Musculoskeletal, Endocrine, Eyes   Negative for: Constitutional, Gastrointestinal, Neurological, Skin, HENT, Cardiovascular, Respiratory, Psychiatric, Allergic/Imm, Heme/Lymph   Last edited by Zenovia Jordan, LPN on 12/09/8248  5:39 PM. (History)       ALLERGIES No Known Allergies  PAST MEDICAL HISTORY Past Medical History:  Diagnosis Date  . Diabetes mellitus   . Hypertension    Past Surgical History:  Procedure Laterality Date  . EYE SURGERY    . TUBAL LIGATION      FAMILY HISTORY  Family History  Problem Relation Age of Onset  . Breast cancer Cousin 25    SOCIAL HISTORY Social History   Tobacco Use  . Smoking status: Never Smoker  . Smokeless tobacco: Never Used  Substance Use Topics  . Alcohol use: No  . Drug use: No         OPHTHALMIC EXAM:  Base Eye Exam    Visual Acuity (Snellen - Linear)      Right Left   Dist cc 20/400 20/40   Dist ph cc 20/400 20/40 +   Correction:  Glasses       Tonometry  (Tonopen, 3:06 PM)      Right Left   Pressure 19 21  Squeezing       Pupils      Dark Light Shape   Right 5 5 Round   Left 6 6 Round  Pharm dilated ou       Visual Fields (Counting fingers)      Left Right    Full Full       Extraocular Movement      Right Left    Full, Ortho Full, Ortho       Neuro/Psych    Oriented x3:  Yes   Mood/Affect:  Normal       Dilation    Both eyes:  1.0% Mydriacyl, 2.5% Phenylephrine @ 3:06 PM        Slit Lamp and Fundus Exam    Slit Lamp Exam      Right Left   Lids/Lashes Dermatochalasis - upper lid Dermatochalasis - upper lid   Conjunctiva/Sclera Nasal and temporal Pinguecula, Melanosis Nasal and temporal Pinguecula, Melanosis   Cornea Arcus, 1+ Punctate epithelial erosions Arcus, Inferior 2+ Punctate epithelial erosions   Anterior Chamber Deep and quiet Deep and quiet   Iris Round and moderately dilated to 69mm, no NVI Round and moderately dilated to 65mm, no NVI   Lens 2+ Nuclear sclerosis, 2+ Cortical cataract 2+ Nuclear sclerosis, 2-3+ Cortical cataract   Vitreous +RBC in anterior vitreous, central VH Vitreous syneresis       Fundus Exam      Right Left   Disc Very hazy view Nasal elveation, ? Early NVD   C/D Ratio  0.1   Macula  Flat, Good foveal reflex, Retinal pigment epithelial mottling, mild Epiretinal membrane, scattered Microaneurysms and DBH, no exudate   Vessels  Mild Vascular attenuation, mildly Tortuous, mild AV crossing changes, nasal NVE   Periphery Attached, PRP scars visible, periphery obscured by central VH Attached, VH and debris settling inferiorly, 360 PRP scars, scattered DBH          IMAGING AND PROCEDURES  Imaging and Procedures for 02/11/18  OCT, Retina - OU - Both Eyes       Right Eye Quality was poor. Central Foveal Thickness: 188. Progression has worsened. Findings include no IRF, normal foveal contour, no SRF.   Left Eye Quality was good. Central Foveal Thickness: 197. Progression has  been stable. Findings include normal foveal contour, no IRF, no SRF.   Notes *Images captured and stored on drive  Diagnosis / Impression:  OD: NFP, No IRF/SRF -- vitreous opacities consistent with VH OS: NFP, No IRF/SRF  Clinical management:  See below  Abbreviations: NFP - Normal foveal profile. CME - cystoid macular edema. PED - pigment epithelial detachment. IRF - intraretinal fluid. SRF - subretinal fluid. EZ - ellipsoid zone. ERM - epiretinal membrane. ORA - outer retinal atrophy. ORT - outer  retinal tubulation. SRHM - subretinal hyper-reflective material                  ASSESSMENT/PLAN:    ICD-10-CM   1. Vitreous hemorrhage of right eye (HCC) H43.11   2. 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 Vitreous hemorrhage, left eye (HCC) H43.12   4. Essential hypertension I10   5. Hypertensive retinopathy of both eyes H35.033   6. Retinal edema H35.81 OCT, Retina - OU - Both Eyes    1. Vitreous Hemorrhage OD-  - acute onset VH OD -- started 4 days ago - pt reports mild subjective improvement since onset - likely secondary to #2 below (PDR) -- retina attached with PRP 360 - discussed treatment options including observation vs anti-VEGF therapy -- pt wishes to observe for now - [facility] precautions reviewed -- minimize activities, keep head elevated, avoid ASA/NSAIDs/blood thinners as able - will follow closely - F/U Friday AM -- possible injection OD  2. Proliferative diabetic retinopathy OU - The incidence, risk factors for progression, natural history and treatment options for diabetic retinopathy were discussed with patient.   - The need for close monitoring of blood glucose, blood pressure, and serum lipids, avoiding cigarette or any type of tobacco, and the need for long term follow up was also discussed with patient. - exam shows bilateral vitreous heme  - OCT today very hazy --  unable to fully assess DME OD  3. Vitreous hemorrhage OS - improving and settling inferiorly  4,5. Hypertensive retinopathy OU - discussed importance of tight BP control and its role in current VH symptoms - monitor  6. Retinal edema - No retinal edema on exam or OCT    Ophthalmic Meds Ordered this visit:  No orders of the defined types were placed in this encounter.      Return in about 4 days (around 02/15/2018) for F/U VH OD, DFE, OCT.  There are no Patient Instructions on file for this visit.   Explained the diagnoses, plan, and follow up with the patient and they expressed understanding.  Patient expressed understanding of the importance of proper follow up care.   This document serves as a record of services personally performed by Gardiner Sleeper, MD, PhD. It was created on their behalf by Catha Brow, [place], a certified ophthalmic assistant. The creation of this record is the provider's dictation and/or activities during the visit.  Electronically signed by: Catha Brow, COA  02/11/2018 4:32 PM   Gardiner Sleeper, M.D., Ph.D. Diseases & Surgery of the Retina and [place] 02/11/2018   I have reviewed the above documentation for accuracy and completeness, and I agree with the above. Gardiner Sleeper, M.D., Ph.D. 02/11/18 5:00 PM     Abbreviations: M myopia (nearsighted); A astigmatism; H hyperopia (farsighted); P presbyopia; Mrx spectacle prescription;  CTL contact lenses; OD right eye; OS left eye; OU both eyes  XT exotropia; ET esotropia; PEK punctate epithelial keratitis; PEE punctate epithelial erosions; DES dry eye syndrome; MGD meibomian gland dysfunction; ATs artificial tears; PFAT's preservative free artificial tears; [place] nuclear sclerotic cataract; PSC posterior subcapsular cataract; ERM epi-retinal membrane; PVD posterior vitreous detachment; RD retinal detachment; DM diabetes mellitus; DR diabetic retinopathy; NPDR  non-proliferative diabetic retinopathy; PDR proliferative diabetic retinopathy; CSME clinically significant macular edema; DME diabetic macular edema; dbh dot blot hemorrhages; CWS cotton wool spot; POAG primary open angle glaucoma; C/D  cup-to-disc ratio; HVF humphrey visual field; GVF goldmann visual field; OCT optical coherence tomography; IOP intraocular pressure; BRVO Branch retinal vein occlusion; CRVO central retinal vein occlusion; CRAO central retinal artery occlusion; BRAO branch retinal artery occlusion; RT retinal tear; SB scleral buckle; PPV pars plana vitrectomy; VH Vitreous hemorrhage; PRP panretinal laser photocoagulation; IVK intravitreal kenalog; VMT vitreomacular traction; MH Macular hole;  NVD neovascularization of the disc; NVE neovascularization elsewhere; AREDS age related eye disease study; ARMD age related macular degeneration; POAG primary open angle glaucoma; EBMD epithelial/anterior basement membrane dystrophy; ACIOL anterior chamber intraocular lens; IOL intraocular lens; PCIOL posterior chamber intraocular lens; Phaco/IOL phacoemulsification with intraocular lens placement; [place] photorefractive keratectomy; LASIK laser assisted in situ keratomileusis; HTN hypertension; DM diabetes mellitus; COPD chronic obstructive pulmonary disease

## 2018-02-14 NOTE — Progress Notes (Signed)
[facility] Note  02/15/2018     CHIEF COMPLAINT Patient presents for Retina Follow Up   HISTORY OF PRESENT ILLNESS: Jenny Cook is a 57 y.o. female who presents to the clinic today for:   HPI    Retina Follow Up    Patient presents with  Other.  In right eye.  This started 1 week ago.  Severity is mild.  Since onset it is stable.  I, the attending physician,  performed the HPI with the patient and updated documentation appropriately.          Comments    F/U VH OD DFE. Patient states her vision remains blurry and cloudy OD. BS 114 this am. Patient is using Latanoprost eye gtt's.       Last edited by Bernarda Caffey, MD on 02/15/2018  9:27 AM. (History)      Referring physician: Foye Spurling, MD [id number] [place], [place] [address]  HISTORICAL INFORMATION:   Selected notes from the MEDICAL RECORD NUMBER Referred from Dr. Elliot Dally for concern of VH OU;   CURRENT MEDICATIONS: Current Outpatient Medications (Ophthalmic Drugs)  Medication Sig  . latanoprost (XALATAN) 0.005 % ophthalmic solution Place 1 drop into both eyes at bedtime.  Marland Kitchen LATANOPROST OP latanoprost 0.005 % eye drops   No current facility-administered medications for this visit.  (Ophthalmic Drugs)   Current Outpatient Medications (Other)  Medication Sig  . amLODipine (NORVASC) 10 MG tablet Take 10 mg by mouth every morning.  . insulin NPH-regular Human (NOVOLIN 70/30) (70-30) 100 UNIT/ML injection Novolin 70/30 U-100 Insulin  take 50u am/60u pm  . irbesartan-hydrochlorothiazide (AVALIDE) 150-12.5 MG tablet   . NOVOLOG MIX 70/30 (70-30) 100 UNIT/ML injection    Current Facility-Administered Medications (Other)  Medication Route  . Bevacizumab (AVASTIN) SOLN 1.25 mg Intravitreal      REVIEW OF SYSTEMS: ROS    Positive for: Endocrine, Eyes   Negative for: Constitutional, Gastrointestinal, Neurological, Skin, Genitourinary, Musculoskeletal, HENT,  Cardiovascular, Respiratory, Psychiatric, Allergic/Imm, Heme/Lymph   Last edited by Zenovia Jordan, LPN on 02/09/5464  6:81 AM. (History)       ALLERGIES No Known Allergies  PAST MEDICAL HISTORY Past Medical History:  Diagnosis Date  . Diabetes mellitus   . Hypertension    Past Surgical History:  Procedure Laterality Date  . EYE SURGERY    . TUBAL LIGATION      FAMILY HISTORY Family History  Problem Relation Age of Onset  . Breast cancer Cousin 25    SOCIAL HISTORY Social History   Tobacco Use  . Smoking status: Never Smoker  . Smokeless tobacco: Never Used  Substance Use Topics  . Alcohol use: No  . Drug use: No         OPHTHALMIC EXAM:  Base Eye Exam    Visual Acuity (Snellen - Linear)      Right Left   Dist [place] 20/200 -2 20/40 +2   Dist ph [place] NI NI       Tonometry (Tonopen, 9:04 AM)      Right Left   Pressure 24 16       Tonometry #2 (Tonopen, 9:25 AM)      Right Left   Pressure 20        Pupils      Dark Light Shape React APD   Right 4 2 Round Brisk None   Left 4 2 Round Brisk None  Visual Fields (Counting fingers)      Left Right    Full Full       Extraocular Movement      Right Left    Full, Ortho Full, Ortho       Neuro/Psych    Oriented x3:  Yes   Mood/Affect:  Normal       Dilation    Both eyes:  1.0% Mydriacyl, 2.5% Phenylephrine @ 9:04 AM        Slit Lamp and Fundus Exam    Slit Lamp Exam      Right Left   Lids/Lashes Dermatochalasis - upper lid Dermatochalasis - upper lid   Conjunctiva/Sclera Nasal and temporal Pinguecula, Melanosis Nasal and temporal Pinguecula, Melanosis   Cornea Arcus, 1+ Punctate epithelial erosions Arcus, Inferior 2+ Punctate epithelial erosions   Anterior Chamber Deep and quiet Deep and quiet   Iris Round and moderately dilated to 13mm, no NVI Round and moderately dilated to 60mm, no NVI   Lens 2+ Nuclear sclerosis, 2+ Cortical cataract 2+ Nuclear sclerosis, 2-3+ Cortical cataract    Vitreous +RBC in anterior vitreous, central VH Vitreous syneresis       Fundus Exam      Right Left   Disc Very hazy view, red heme obscuring disc Nasal elveation, ?Early NVD   C/D Ratio  0.1   Macula grossly flat; no details visible Flat, Good foveal reflex, Retinal pigment epithelial mottling, mild Epiretinal membrane, scattered Microaneurysms and DBH, no exudate   Vessels mild attenuation Mild Vascular attenuation, mildly Tortuous, mild AV crossing changes, nasal NVE   Periphery Attached, PRP scars visible, posterior pole obscured by central VH Attached, VH and debris settling inferiorly, 360 PRP scars, scattered DBH          IMAGING AND PROCEDURES  Imaging and Procedures for 02/11/18  OCT, Retina - OU - Both Eyes       Right Eye Quality was poor. Central Foveal Thickness: (Unable to obtain). Progression has worsened. Findings include no IRF, normal foveal contour, no SRF.   Left Eye Quality was good. Central Foveal Thickness: 205. Progression has been stable. Findings include normal foveal contour, no IRF, no SRF (Cystic changes).   Notes *Images captured and stored on drive  Diagnosis / Impression:  OD: NFP, No IRF/SRF -- vitreous opacities consistent with VH OS: NFP, No IRF/SRF  Clinical management:  See below  Abbreviations: NFP - Normal foveal profile. CME - cystoid macular edema. PED - pigment epithelial detachment. IRF - intraretinal fluid. SRF - subretinal fluid. EZ - ellipsoid zone. ERM - epiretinal membrane. ORA - outer retinal atrophy. ORT - outer retinal tubulation. SRHM - subretinal hyper-reflective material         Intravitreal Injection, Pharmacologic Agent - OD - Right Eye       Time Out 02/15/2018. 9:57 AM. Confirmed correct patient, procedure, site, and patient consented.   Anesthesia Topical anesthesia was used. Anesthetic medications included Lidocaine 2%, Tetracaine 0.5%.   Procedure Preparation included 5% betadine to ocular surface. A  supplied needle was used.   Injection: 1.25 mg Bevacizumab 1.25mg /0.76ml   NDC: 50932-671-24    Lot: [id number]    Expiration Date: 04/03/2018   Route: Intravitreal   Site: Right Eye   Waste: 0 mg  Post-op Post injection exam found visual acuity of at least counting fingers. The patient tolerated the procedure well. There were no complications. The patient received written and verbal post procedure care education.  ASSESSMENT/PLAN:    ICD-10-CM   1. Vitreous hemorrhage of right eye (HCC) H43.11 Intravitreal Injection, Pharmacologic Agent - OD - Right Eye    Bevacizumab (AVASTIN) SOLN 1.25 mg  2. 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 Vitreous hemorrhage, left eye (HCC) H43.12   4. Essential hypertension I10   5. Hypertensive retinopathy of both eyes H35.033   6. Retinal edema H35.81 OCT, Retina - OU - Both Eyes    1. Vitreous Hemorrhage OD-  - acute onset VH OD -- started ~02/07/18 - pt reports mild improvement since onset - likely secondary to #2 below (PDR) -- retina attached with PRP 360 - in f/u today, mild improvement in [place] compared to 02/11/18 exam, but still persistent - recommend IVA #1 OD today (05.10.19) - pt wishes to proceed with IVA OD - RBA of procedure discussed, questions answered - informed consent obtained and signed - see procedure note - will follow closely - VH precautions reviewed -- minimize activities, keep head elevated, avoid ASA/NSAIDs/blood thinners as able - F/U 1 week -- wide field OCT, FA (optos, transit OS)  2. Proliferative diabetic retinopathy OU - The incidence, risk factors for progression, natural history and treatment options for diabetic retinopathy were discussed with patient.   - The need for close monitoring of blood glucose, blood  pressure, and serum lipids, avoiding cigarette or any type of tobacco, and the need for long term follow up was also discussed with patient. - exam shows bilateral vitreous heme  - OCT today very hazy -- unable to fully assess DME OD  3. Vitreous hemorrhage OS - improving and settling inferiorly  4,5. Hypertensive retinopathy OU - discussed importance of tight BP control and its role in current VH symptoms - monitor  6. Retinal edema - No retinal edema on exam or OCT    Ophthalmic Meds Ordered this visit:  Meds ordered this encounter  Medications  . Bevacizumab (AVASTIN) SOLN 1.25 mg       Return in about 1 week (around 02/22/2018) for F/U VH OD, DFE, OCT, FA.  There are no Patient Instructions on file for this visit.   Explained the diagnoses, plan, and follow up with the patient and they expressed understanding.  Patient expressed understanding of the importance of proper follow up care.   This document serves as a record of services personally performed by Gardiner Sleeper, MD, PhD. It was created on their behalf by Catha Brow, [place], a certified ophthalmic assistant. The creation of this record is the provider's dictation and/or activities during the visit.  Electronically signed by: Catha Brow, COA  05.09.19 8:43 AM   Gardiner Sleeper, M.D., Ph.D. Diseases & Surgery of the Retina and [place] 02/14/2018   I have reviewed the above documentation for accuracy and completeness, and I agree with the above. Gardiner Sleeper, M.D., Ph.D. 02/18/18 8:46 AM     Abbreviations: M myopia (nearsighted); A astigmatism; H hyperopia (farsighted); P presbyopia; Mrx spectacle prescription;  CTL contact lenses; OD right eye; OS left eye; OU both eyes  XT exotropia; ET esotropia; PEK punctate epithelial keratitis; PEE punctate epithelial erosions; DES dry eye syndrome; MGD meibomian gland dysfunction; ATs artificial tears; PFAT's preservative  free artificial tears; [place] nuclear sclerotic cataract; PSC posterior subcapsular cataract; ERM epi-retinal membrane;  PVD posterior vitreous detachment; RD retinal detachment; DM diabetes mellitus; DR diabetic retinopathy; NPDR non-proliferative diabetic retinopathy; PDR proliferative diabetic retinopathy; CSME clinically significant macular edema; DME diabetic macular edema; dbh dot blot hemorrhages; CWS cotton wool spot; POAG primary open angle glaucoma; C/D cup-to-disc ratio; HVF humphrey visual field; GVF goldmann visual field; OCT optical coherence tomography; IOP intraocular pressure; BRVO Branch retinal vein occlusion; CRVO central retinal vein occlusion; CRAO central retinal artery occlusion; BRAO branch retinal artery occlusion; RT retinal tear; SB scleral buckle; PPV pars plana vitrectomy; VH Vitreous hemorrhage; PRP panretinal laser photocoagulation; IVK intravitreal kenalog; VMT vitreomacular traction; MH Macular hole;  NVD neovascularization of the disc; NVE neovascularization elsewhere; AREDS age related eye disease study; ARMD age related macular degeneration; POAG primary open angle glaucoma; EBMD epithelial/anterior basement membrane dystrophy; ACIOL anterior chamber intraocular lens; IOL intraocular lens; PCIOL posterior chamber intraocular lens; Phaco/IOL phacoemulsification with intraocular lens placement; [place] photorefractive keratectomy; LASIK laser assisted in situ keratomileusis; HTN hypertension; DM diabetes mellitus; COPD chronic obstructive pulmonary disease

## 2018-02-15 ENCOUNTER — Encounter (INDEPENDENT_AMBULATORY_CARE_PROVIDER_SITE_OTHER): Payer: Self-pay | Admitting: Ophthalmology

## 2018-02-15 ENCOUNTER — Ambulatory Visit (INDEPENDENT_AMBULATORY_CARE_PROVIDER_SITE_OTHER): Payer: BLUE CROSS/BLUE SHIELD | Admitting: Ophthalmology

## 2018-02-15 DIAGNOSIS — H35033 Hypertensive retinopathy, bilateral: Secondary | ICD-10-CM

## 2018-02-15 DIAGNOSIS — I10 Essential (primary) hypertension: Secondary | ICD-10-CM

## 2018-02-15 DIAGNOSIS — E113593 Type 2 diabetes mellitus with proliferative diabetic retinopathy without macular edema, bilateral: Secondary | ICD-10-CM

## 2018-02-15 DIAGNOSIS — H4311 Vitreous hemorrhage, right eye: Secondary | ICD-10-CM | POA: Diagnosis not present

## 2018-02-15 DIAGNOSIS — H3581 Retinal edema: Secondary | ICD-10-CM

## 2018-02-15 DIAGNOSIS — H4312 Vitreous hemorrhage, left eye: Secondary | ICD-10-CM

## 2018-02-18 ENCOUNTER — Encounter (INDEPENDENT_AMBULATORY_CARE_PROVIDER_SITE_OTHER): Payer: Self-pay | Admitting: Ophthalmology

## 2018-02-18 DIAGNOSIS — H4311 Vitreous hemorrhage, right eye: Secondary | ICD-10-CM | POA: Diagnosis not present

## 2018-02-18 DIAGNOSIS — E113593 Type 2 diabetes mellitus with proliferative diabetic retinopathy without macular edema, bilateral: Secondary | ICD-10-CM | POA: Diagnosis not present

## 2018-02-18 MED ORDER — BEVACIZUMAB CHEMO INJECTION 1.25MG/0.05ML SYRINGE FOR KALEIDOSCOPE
1.2500 mg | INTRAVITREAL | Status: AC
  Administered 2018-02-18: 1.25 mg via INTRAVITREAL

## 2018-02-21 NOTE — Progress Notes (Addendum)
[facility] Note  02/22/2018     CHIEF COMPLAINT Patient presents for Retina Follow Up   HISTORY OF PRESENT ILLNESS: Jenny Cook is a 57 y.o. female who presents to the clinic today for:   HPI    Retina Follow Up    Patient presents with  Other.  In right eye.  Severity is moderate.  Duration of 1 month.  Since onset it is stable.  I, the attending physician,  performed the HPI with the patient and updated documentation appropriately.          Comments    Pt presents for vitreous hemorrhage OD F/U, pt states she is seeing a little bit more clearly out of OD, but states she still has a "cloud" and "red streaks", pt denies flashes, pain or wavy vision, pt is using Latanoprost gtts, pts BS was 70 this morning, pt just started a new cholesterol medication, but she cannot remember the name of it,        Last edited by Bernarda Caffey, MD on 02/22/2018  9:49 AM. (History)      Referring physician: Foye Spurling, MD [address], [place] [address]  HISTORICAL INFORMATION:   Selected notes from the MEDICAL RECORD NUMBER Referred from Dr. Elliot Dally for concern of VH OU;   CURRENT MEDICATIONS: Current Outpatient Medications (Ophthalmic Drugs)  Medication Sig  . latanoprost (XALATAN) 0.005 % ophthalmic solution Place 1 drop into both eyes at bedtime.  Marland Kitchen LATANOPROST OP latanoprost 0.005 % eye drops   No current facility-administered medications for this visit.  (Ophthalmic Drugs)   Current Outpatient Medications (Other)  Medication Sig  . amLODipine (NORVASC) 10 MG tablet Take 10 mg by mouth every morning.  . insulin NPH-regular Human (NOVOLIN 70/30) (70-30) 100 UNIT/ML injection Novolin 70/30 U-100 Insulin  take 50u am/60u pm  . irbesartan-hydrochlorothiazide (AVALIDE) 150-12.5 MG tablet   . NOVOLOG MIX 70/30 (70-30) 100 UNIT/ML injection    Current Facility-Administered Medications (Other)  Medication Route  .  Bevacizumab (AVASTIN) SOLN 1.25 mg Intravitreal      REVIEW OF SYSTEMS: ROS    Positive for: Endocrine, Cardiovascular, Eyes, Allergic/Imm   Negative for: Constitutional, Gastrointestinal, Neurological, Skin, Genitourinary, Musculoskeletal, HENT, Respiratory, Psychiatric, Heme/Lymph   Last edited by Debbrah Alar, COT on 02/22/2018  9:12 AM. (History)       ALLERGIES No Known Allergies  PAST MEDICAL HISTORY Past Medical History:  Diagnosis Date  . Diabetes mellitus   . Hypertension    Past Surgical History:  Procedure Laterality Date  . EYE SURGERY    . TUBAL LIGATION      FAMILY HISTORY Family History  Problem Relation Age of Onset  . Breast cancer Cousin 25    SOCIAL HISTORY Social History   Tobacco Use  . Smoking status: Never Smoker  . Smokeless tobacco: Never Used  Substance Use Topics  . Alcohol use: No  . Drug use: No         OPHTHALMIC EXAM:  Base Eye Exam    Visual Acuity (Snellen - Linear)      Right Left   Dist cc 20/200 -1 20/50 -1   Dist ph cc NI NI   Correction:  Glasses       Tonometry (Tonopen, 9:18 AM)      Right Left   Pressure 18 18       Pupils      Dark Light Shape React  APD   Right 5 3 Round Brisk None   Left 5 3 Round Brisk None       Visual Fields (Counting fingers)      Left Right    Full Full       Extraocular Movement      Right Left    Full, Ortho Full, Ortho       Neuro/Psych    Oriented x3:  Yes   Mood/Affect:  Normal       Dilation    Both eyes:  1.0% Mydriacyl, 2.5% Phenylephrine @ 9:18 AM        Slit Lamp and Fundus Exam    Slit Lamp Exam      Right Left   Lids/Lashes Dermatochalasis - upper lid Dermatochalasis - upper lid   Conjunctiva/Sclera Nasal and temporal Pinguecula, Melanosis Nasal and temporal Pinguecula, Melanosis   Cornea Arcus, 1+ Punctate epithelial erosions Arcus, Inferior 2+ Punctate epithelial erosions   Anterior Chamber Deep and quiet Deep and quiet   Iris Round and  moderately dilated to 23mm, no NVI Round and moderately dilated to 24mm, no NVI   Lens 2+ Nuclear sclerosis, 2+ Cortical cataract 2+ Nuclear sclerosis, 2-3+ Cortical cataract   Vitreous +RBC in anterior vitreous, central VH Vitreous syneresis       Fundus Exam      Right Left   Disc Very hazy view, red heme obscuring disc Nasal elveation, ?Early NVD   C/D Ratio  0.1   Macula grossly flat; no details visible Flat, Good foveal reflex, Retinal pigment epithelial mottling, mild Epiretinal membrane, scattered Microaneurysms and DBH, no exudate   Vessels mild attenuation Mild Vascular attenuation, mildly Tortuous, mild AV crossing changes, nasal NVE   Periphery Attached, PRP scars visible, posterior pole obscured by central VH Attached, VH and debris settling inferiorly, 360 PRP scars, scattered DBH          IMAGING AND PROCEDURES  Imaging and Procedures for 02/11/18  OCT, Retina - OU - Both Eyes       Right Eye Quality was poor. Central Foveal Thickness: (Unable to obtain). Progression has been stable. Findings include no IRF, normal foveal contour, no SRF.   Left Eye Quality was good. Central Foveal Thickness: 204. Progression has been stable. Findings include normal foveal contour, no IRF, no SRF (Cystic changes).   Notes *Images captured and stored on drive  Diagnosis / Impression:  OD: NFP, No IRF/SRF -- vitreous opacities consistent with VH OS: NFP, No IRF/SRF  Clinical management:  See below  Abbreviations: NFP - Normal foveal profile. CME - cystoid macular edema. PED - pigment epithelial detachment. IRF - intraretinal fluid. SRF - subretinal fluid. EZ - ellipsoid zone. ERM - epiretinal membrane. ORA - outer retinal atrophy. ORT - outer retinal tubulation. SRHM - subretinal hyper-reflective material                  ASSESSMENT/PLAN:    ICD-10-CM   1. Vitreous hemorrhage of right eye (HCC) H43.11 CANCELED: Fluorescein Angiography Optos (Transit OS)  2.  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 Vitreous hemorrhage, left eye (HCC) H43.12 CANCELED: Fluorescein Angiography Optos (Transit OS)  4. Essential hypertension I10   5. Hypertensive retinopathy of both eyes H35.033   6. Retinal edema H35.81 OCT, Retina - OU - Both Eyes    1. Vitreous Hemorrhage OD - acute onset VH OD --  started ~02/07/18 - pt reports mild improvement since onset - likely secondary to #2 below (PDR) -- retina attached with PRP 360 - in f/u today, mild improvement in [place] compared to 02/11/18 exam, but still persistent - S/P IVA #1 OD (05.10.19) - today, VH improved but remains central - will continue to follow closely - VH precautions reviewed -- minimize activities, keep head elevated, avoid ASA/NSAIDs/blood thinners as able - F/U 1 week  2. Proliferative diabetic retinopathy OU - The incidence, risk factors for progression, natural history and treatment options for diabetic retinopathy were discussed with patient.   - The need for close monitoring of blood glucose, blood pressure, and serum lipids, avoiding cigarette or any type of tobacco, and the need for long term follow up was also discussed with patient. - exam shows bilateral vitreous heme  - OCT on 05.10.19 very hazy -- unable to fully assess DME OD  3. Vitreous hemorrhage OS - improving and settling inferiorly  4,5. Hypertensive retinopathy OU - discussed importance of tight BP control and its role in current VH symptoms - monitor  6. Retinal edema - No retinal edema on exam or OCT     Ophthalmic Meds Ordered this visit:  No orders of the defined types were placed in this encounter.      Return in about 1 week (around 03/01/2018) for Dilated Exam.  There are no Patient Instructions on file for this visit.   Explained the diagnoses, plan, and follow up with  the patient and they expressed understanding.  Patient expressed understanding of the importance of proper follow up care.   This document serves as a record of services personally performed by Gardiner Sleeper, MD, PhD. It was created on their behalf by Catha Brow, [place], a certified ophthalmic assistant. The creation of this record is the provider's dictation and/or activities during the visit.  Electronically signed by: Catha Brow, [place]  05.16.19 11:00 AM   Gardiner Sleeper, M.D., Ph.D. Diseases & Surgery of the Retina and [place] 02/22/2018  I have reviewed the above documentation for accuracy and completeness, and I agree with the above. Gardiner Sleeper, M.D., Ph.D. 02/22/18 11:00 AM     Abbreviations: M myopia (nearsighted); A astigmatism; H hyperopia (farsighted); P presbyopia; Mrx spectacle prescription;  CTL contact lenses; OD right eye; OS left eye; OU both eyes  XT exotropia; ET esotropia; PEK punctate epithelial keratitis; PEE punctate epithelial erosions; DES dry eye syndrome; MGD meibomian gland dysfunction; ATs artificial tears; PFAT's preservative free artificial tears; [place] nuclear sclerotic cataract; PSC posterior subcapsular cataract; ERM epi-retinal membrane; PVD posterior vitreous detachment; RD retinal detachment; DM diabetes mellitus; DR diabetic retinopathy; NPDR non-proliferative diabetic retinopathy; PDR proliferative diabetic retinopathy; CSME clinically significant macular edema; DME diabetic macular edema; dbh dot blot hemorrhages; CWS cotton wool spot; POAG primary open angle glaucoma; C/D cup-to-disc ratio; HVF humphrey visual field; GVF goldmann visual field; OCT optical coherence tomography; IOP intraocular pressure; BRVO Branch retinal vein occlusion; CRVO central retinal vein occlusion; CRAO central retinal artery occlusion; BRAO branch retinal artery occlusion; RT retinal tear; SB scleral buckle; PPV pars plana vitrectomy;  VH Vitreous hemorrhage; PRP panretinal laser photocoagulation; IVK intravitreal kenalog; VMT vitreomacular traction; MH Macular hole;  NVD neovascularization of the disc; NVE neovascularization elsewhere; AREDS age related eye disease study; ARMD age related macular degeneration; POAG primary open angle glaucoma; EBMD epithelial/anterior basement membrane dystrophy; ACIOL anterior chamber intraocular lens; IOL intraocular lens; PCIOL posterior chamber intraocular  lens; Phaco/IOL phacoemulsification with intraocular lens placement; [place] photorefractive keratectomy; LASIK laser assisted in situ keratomileusis; HTN hypertension; DM diabetes mellitus; COPD chronic obstructive pulmonary disease

## 2018-02-22 ENCOUNTER — Encounter (INDEPENDENT_AMBULATORY_CARE_PROVIDER_SITE_OTHER): Payer: Self-pay | Admitting: Ophthalmology

## 2018-02-22 ENCOUNTER — Ambulatory Visit (INDEPENDENT_AMBULATORY_CARE_PROVIDER_SITE_OTHER): Payer: BLUE CROSS/BLUE SHIELD | Admitting: Ophthalmology

## 2018-02-22 DIAGNOSIS — H4311 Vitreous hemorrhage, right eye: Secondary | ICD-10-CM | POA: Diagnosis not present

## 2018-02-22 DIAGNOSIS — H35033 Hypertensive retinopathy, bilateral: Secondary | ICD-10-CM

## 2018-02-22 DIAGNOSIS — I10 Essential (primary) hypertension: Secondary | ICD-10-CM | POA: Diagnosis not present

## 2018-02-22 DIAGNOSIS — H3581 Retinal edema: Secondary | ICD-10-CM

## 2018-02-22 DIAGNOSIS — E113593 Type 2 diabetes mellitus with proliferative diabetic retinopathy without macular edema, bilateral: Secondary | ICD-10-CM

## 2018-02-22 DIAGNOSIS — H4312 Vitreous hemorrhage, left eye: Secondary | ICD-10-CM

## 2018-02-28 NOTE — Progress Notes (Signed)
[facility] Note  03/01/2018     CHIEF COMPLAINT Patient presents for Retina Follow Up   HISTORY OF PRESENT ILLNESS: Jenny Cook is a 57 y.o. female who presents to the clinic today for:   HPI    Retina Follow Up    Patient presents with  Other (Vitreous heme OD).  In right eye.  This started 3 weeks ago.  Severity is moderate.  Duration of 3 weeks.  I, the attending physician,  performed the HPI with the patient and updated documentation appropriately.          Comments    Presents for follow-up of vitreous hemorrhage OD.  Patient states vision improved some OD. BS was 68 this am. A1c was 8.1 in April 2019. Vision seems OK OS. Patient denies new floaters or flashes.        Last edited by Bernarda Caffey, MD on 03/01/2018  1:31 PM. (History)     Pt states she is doing well, pt states she is on vacation starting today  Referring physician: Foye Spurling, MD [address], [place] [address]  HISTORICAL INFORMATION:   Selected notes from the MEDICAL RECORD NUMBER Referred from Dr. Elliot Dally for concern of VH OU;   CURRENT MEDICATIONS: Current Outpatient Medications (Ophthalmic Drugs)  Medication Sig  . latanoprost (XALATAN) 0.005 % ophthalmic solution Place 1 drop into both eyes at bedtime.  Marland Kitchen LATANOPROST OP latanoprost 0.005 % eye drops   No current facility-administered medications for this visit.  (Ophthalmic Drugs)   Current Outpatient Medications (Other)  Medication Sig  . amLODipine (NORVASC) 10 MG tablet Take 10 mg by mouth every morning.  . insulin NPH-regular Human (NOVOLIN 70/30) (70-30) 100 UNIT/ML injection Novolin 70/30 U-100 Insulin  take 50u am/60u pm  . irbesartan-hydrochlorothiazide (AVALIDE) 150-12.5 MG tablet   . NOVOLOG MIX 70/30 (70-30) 100 UNIT/ML injection    Current Facility-Administered Medications (Other)  Medication Route  . Bevacizumab (AVASTIN) SOLN 1.25 mg Intravitreal       REVIEW OF SYSTEMS: ROS    Positive for: Endocrine, Cardiovascular, Eyes, Allergic/Imm   Negative for: Constitutional, Gastrointestinal, Neurological, Skin, Genitourinary, Musculoskeletal, HENT, Respiratory, Psychiatric, Heme/Lymph   Last edited by Roselee Nova D on 03/01/2018  1:05 PM. (History)       ALLERGIES No Known Allergies  PAST MEDICAL HISTORY Past Medical History:  Diagnosis Date  . Diabetes mellitus   . Hypertension    Past Surgical History:  Procedure Laterality Date  . EYE SURGERY    . TUBAL LIGATION      FAMILY HISTORY Family History  Problem Relation Age of Onset  . Breast cancer Cousin 25  . Diabetes Cousin   . Diabetes Mother   . Diabetes Father   . Diabetes Sister   . Diabetes Brother   . Diabetes Maternal Aunt   . Diabetes Maternal Uncle   . Diabetes Paternal Aunt   . Diabetes Paternal Uncle     SOCIAL HISTORY Social History   Tobacco Use  . Smoking status: Never Smoker  . Smokeless tobacco: Never Used  Substance Use Topics  . Alcohol use: No  . Drug use: No         OPHTHALMIC EXAM:  Base Eye Exam    Visual Acuity (Snellen - Linear)      Right Left   Dist [place] 20/200 -1 20/40 -2   Dist ph [place] NI 20/40 -2  Tonometry (Tonopen, 1:16 PM)      Right Left   Pressure 21 20       Pupils      Dark Light Shape React APD   Right 4 2 Round Brisk None   Left 4 2 Round Brisk None       Visual Fields (Counting fingers)      Left Right    Full Full       Extraocular Movement      Right Left    Full, Ortho Full, Ortho       Neuro/Psych    Oriented x3:  Yes   Mood/Affect:  Normal       Dilation    Both eyes:  1.0% Mydriacyl, 2.5% Phenylephrine @ 1:16 PM        Slit Lamp and Fundus Exam    Slit Lamp Exam      Right Left   Lids/Lashes Dermatochalasis - upper lid Dermatochalasis - upper lid   Conjunctiva/Sclera Nasal and temporal Pinguecula, Melanosis Nasal and temporal Pinguecula, Melanosis   Cornea Arcus, 1+  Punctate epithelial erosions Arcus, Inferior 2+ Punctate epithelial erosions   Anterior Chamber Deep and quiet Deep and quiet   Iris Round and moderately dilated to 50mm, no NVI Round and moderately dilated to 86mm, no NVI   Lens 2+ Nuclear sclerosis, 2+ Cortical cataract 2+ Nuclear sclerosis, 2-3+ Cortical cataract   Vitreous +RBC in anterior vitreous, central VH --improving Vitreous syneresis       Fundus Exam      Right Left   Disc Very hazy view, blood stained vitreous overlying disc Nasal elveation, ?Early NVD   C/D Ratio  0.1   Macula grossly flat; no details visible Flat, Good foveal reflex, Retinal pigment epithelial mottling, mild Epiretinal membrane, scattered Microaneurysms and DBH, no exudate   Vessels mild attenuation Mild Vascular attenuation, mildly Tortuous, mild AV crossing changes, nasal NVE   Periphery Attached, PRP scars visible, posterior pole obscured by central VH -- clearing Attached, VH and debris settling inferiorly, 360 PRP scars, scattered DBH          IMAGING AND PROCEDURES  Imaging and Procedures for 02/11/18  OCT, Retina - OU - Both Eyes       Right Eye Quality was poor. Central Foveal Thickness: 194 (Unable to obtain). Progression has improved. Findings include no IRF, normal foveal contour, no SRF.   Left Eye Quality was good. Central Foveal Thickness: 201. Progression has been stable. Findings include normal foveal contour, no IRF, no SRF (Cystic changes).   Notes *Images captured and stored on drive  Diagnosis / Impression:  OD: NFP, No IRF/SRF -- vitreous opacities consistent with VH -- improving OS: NFP, No IRF/SRF  Clinical management:  See below  Abbreviations: NFP - Normal foveal profile. CME - cystoid macular edema. PED - pigment epithelial detachment. IRF - intraretinal fluid. SRF - subretinal fluid. EZ - ellipsoid zone. ERM - epiretinal membrane. ORA - outer retinal atrophy. ORT - outer retinal tubulation. SRHM - subretinal  hyper-reflective material                  ASSESSMENT/PLAN:    ICD-10-CM   1. Vitreous hemorrhage of right eye (HCC) H43.11   2. 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 Vitreous hemorrhage, left eye (HCC) H43.12   4. Essential hypertension I10   5. Hypertensive retinopathy of both eyes H35.033  6. Retinal edema H35.81 OCT, Retina - OU - Both Eyes    1. Vitreous Hemorrhage OD - acute onset VH OD -- started ~02/07/18 - pt reports continued improvement since onset - likely secondary to #2 below (PDR) -- retina attached with PRP 360 - S/P IVA #1 OD (05.10.19) - on exam today, further improvement in [place] compared to prior exam, but still persistent centrally - will continue to follow closely - VH precautions reviewed -- minimize activities, keep head elevated, avoid ASA/NSAIDs/blood thinners as able - F/U 1.5 wks  2. Proliferative diabetic retinopathy OU - The incidence, risk factors for progression, natural history and treatment options for diabetic retinopathy were discussed with patient.   - The need for close monitoring of blood glucose, blood pressure, and serum lipids, avoiding cigarette or any type of tobacco, and the need for long term follow up was also discussed with patient. - exam shows bilateral vitreous heme  - OCT today able to see no significant IRF/SRF in macula OD  3. Vitreous hemorrhage OS - Improving and settling inferiorly  4,5. Hypertensive retinopathy OU - discussed importance of tight BP control and its role in current VH symptoms - monitor  6. Retinal edema - No retinal edema on exam or OCT    Ophthalmic Meds Ordered this visit:  No orders of the defined types were placed in this encounter.      Return for 10 days.  There are no Patient Instructions on file for this visit.   Explained the diagnoses, plan, and follow up with the patient and they  expressed understanding.  Patient expressed understanding of the importance of proper follow up care.   This document serves as a record of services personally performed by Gardiner Sleeper, MD, PhD. It was created on their behalf by Ernest Mallick, OA, an ophthalmic assistant. The creation of this record is the provider's dictation and/or activities during the visit.    Electronically signed by: Ernest Mallick, OA  05.23.19 1:34 AM   Gardiner Sleeper, M.D., Ph.D. Diseases & Surgery of the Retina and [place] 03/01/2018  I have reviewed the above documentation for accuracy and completeness, and I agree with the above. Gardiner Sleeper, M.D., Ph.D. 03/04/18 1:37 AM      Abbreviations: M myopia (nearsighted); A astigmatism; H hyperopia (farsighted); P presbyopia; Mrx spectacle prescription;  CTL contact lenses; OD right eye; OS left eye; OU both eyes  XT exotropia; ET esotropia; PEK punctate epithelial keratitis; PEE punctate epithelial erosions; DES dry eye syndrome; MGD meibomian gland dysfunction; ATs artificial tears; PFAT's preservative free artificial tears; [place] nuclear sclerotic cataract; PSC posterior subcapsular cataract; ERM epi-retinal membrane; PVD posterior vitreous detachment; RD retinal detachment; DM diabetes mellitus; DR diabetic retinopathy; NPDR non-proliferative diabetic retinopathy; PDR proliferative diabetic retinopathy; CSME clinically significant macular edema; DME diabetic macular edema; dbh dot blot hemorrhages; CWS cotton wool spot; POAG primary open angle glaucoma; C/D cup-to-disc ratio; HVF humphrey visual field; GVF goldmann visual field; OCT optical coherence tomography; IOP intraocular pressure; BRVO Branch retinal vein occlusion; CRVO central retinal vein occlusion; CRAO central retinal artery occlusion; BRAO branch retinal artery occlusion; RT retinal tear; SB scleral buckle; PPV pars plana vitrectomy; VH Vitreous hemorrhage; PRP  panretinal laser photocoagulation; IVK intravitreal kenalog; VMT vitreomacular traction; MH Macular hole;  NVD neovascularization of the disc; NVE neovascularization elsewhere; AREDS age related eye disease study; ARMD age related macular degeneration; POAG primary open angle glaucoma; EBMD epithelial/anterior basement membrane dystrophy;  ACIOL anterior chamber intraocular lens; IOL intraocular lens; PCIOL posterior chamber intraocular lens; Phaco/IOL phacoemulsification with intraocular lens placement; [place] photorefractive keratectomy; LASIK laser assisted in situ keratomileusis; HTN hypertension; DM diabetes mellitus; COPD chronic obstructive pulmonary disease

## 2018-03-01 ENCOUNTER — Encounter (INDEPENDENT_AMBULATORY_CARE_PROVIDER_SITE_OTHER): Payer: Self-pay | Admitting: Ophthalmology

## 2018-03-01 ENCOUNTER — Other Ambulatory Visit: Payer: BLUE CROSS/BLUE SHIELD | Admitting: Podiatry

## 2018-03-01 ENCOUNTER — Ambulatory Visit (INDEPENDENT_AMBULATORY_CARE_PROVIDER_SITE_OTHER): Payer: BLUE CROSS/BLUE SHIELD | Admitting: Ophthalmology

## 2018-03-01 DIAGNOSIS — H4311 Vitreous hemorrhage, right eye: Secondary | ICD-10-CM

## 2018-03-01 DIAGNOSIS — I10 Essential (primary) hypertension: Secondary | ICD-10-CM | POA: Diagnosis not present

## 2018-03-01 DIAGNOSIS — H4312 Vitreous hemorrhage, left eye: Secondary | ICD-10-CM

## 2018-03-01 DIAGNOSIS — H35033 Hypertensive retinopathy, bilateral: Secondary | ICD-10-CM

## 2018-03-01 DIAGNOSIS — E113593 Type 2 diabetes mellitus with proliferative diabetic retinopathy without macular edema, bilateral: Secondary | ICD-10-CM | POA: Diagnosis not present

## 2018-03-01 DIAGNOSIS — H3581 Retinal edema: Secondary | ICD-10-CM

## 2018-03-04 ENCOUNTER — Encounter (INDEPENDENT_AMBULATORY_CARE_PROVIDER_SITE_OTHER): Payer: Self-pay | Admitting: Ophthalmology

## 2018-03-09 ENCOUNTER — Ambulatory Visit (HOSPITAL_COMMUNITY)
Admission: EM | Admit: 2018-03-09 | Discharge: 2018-03-09 | Disposition: A | Discharge status: Home / Self Care | Payer: BLUE CROSS/BLUE SHIELD | Attending: Internal Medicine | Admitting: Internal Medicine

## 2018-03-09 ENCOUNTER — Other Ambulatory Visit: Payer: Self-pay

## 2018-03-09 ENCOUNTER — Encounter (HOSPITAL_COMMUNITY): Payer: Self-pay | Admitting: *Deleted

## 2018-03-09 DIAGNOSIS — J020 Streptococcal pharyngitis: Secondary | ICD-10-CM

## 2018-03-09 LAB — POCT RAPID STREP A: Streptococcus, Group A Screen (Direct): POSITIVE — AB

## 2018-03-09 MED ORDER — ACETAMINOPHEN 325 MG PO TABS
ORAL_TABLET | ORAL | Status: AC
  Filled 2018-03-09: qty 2

## 2018-03-09 MED ORDER — ACETAMINOPHEN 325 MG PO TABS
650.0000 mg | ORAL_TABLET | Freq: Once | ORAL | Status: AC
  Administered 2018-03-09: 650 mg via ORAL

## 2018-03-09 MED ORDER — AMOXICILLIN 500 MG PO CAPS: 500.0000 mg | ORAL_CAPSULE | Freq: Two times a day (BID) | ORAL | 0 refills | Status: AC

## 2018-03-09 NOTE — ED Provider Notes (Signed)
[facility]    CSN: [id number] Arrival date & time: 03/09/18  1226     History   Chief Complaint Chief Complaint  Patient presents with  . Nasal Congestion  . Otalgia  . Sore Throat  . Headache  . Fever    HPI Jenny Cook is a 57 y.o. female.   Jenny Cook presents with complaints of headache, sore throat, neck pain, ear pain and chills which started yesterday. No cough. Stomach feels somewhat upset but without vomiting or diarrhea. No skin rash. Took tylenol yesterday which did help some. Son's girlfriend had strep throat last week. Hx htn and dm.   ROS per HPI.      Past Medical History:  Diagnosis Date  . Diabetes mellitus   . Hypertension     Patient Active Problem List   Diagnosis Date Noted  . Unilateral primary osteoarthritis, right knee 08/25/2016  . Chronic pain of right knee 08/25/2016    Past Surgical History:  Procedure Laterality Date  . EYE SURGERY    . TUBAL LIGATION      OB History   None      Home Medications    Prior to Admission medications   Medication Sig Start Date End Date Taking? Authorizing Provider  acetaminophen (TYLENOL) 325 MG tablet Take 650 mg by mouth every 6 (six) hours as needed.   Yes [provider]  NOVOLOG MIX 70/30 (70-30) 100 UNIT/ML injection  01/30/14  Yes [provider]  amLODipine (NORVASC) 10 MG tablet Take 10 mg by mouth every morning.    [provider]  amoxicillin (AMOXIL) 500 MG capsule Take 1 capsule (500 mg total) by mouth 2 (two) times daily for 10 days. 03/09/18 03/19/18  Zigmund Gottron, NP  insulin NPH-regular Human (NOVOLIN 70/30) (70-30) 100 UNIT/ML injection Novolin 70/30 U-100 Insulin  take 50u am/60u pm    [provider]  irbesartan-hydrochlorothiazide (AVALIDE) 150-12.5 MG tablet  05/11/17   [provider]  latanoprost (XALATAN) 0.005 % ophthalmic solution Place 1 drop into both eyes at bedtime.    [provider]  LATANOPROST  OP latanoprost 0.005 % eye drops    [provider]    Family History Family History  Problem Relation Age of Onset  . Breast cancer Cousin 25  . Diabetes Cousin   . Diabetes Mother   . Diabetes Father   . Diabetes Sister   . Diabetes Brother   . Diabetes Maternal Aunt   . Diabetes Maternal Uncle   . Diabetes Paternal Aunt   . Diabetes Paternal Uncle     Social History Social History   Tobacco Use  . Smoking status: Never Smoker  . Smokeless tobacco: Never Used  Substance Use Topics  . Alcohol use: No  . Drug use: No     Allergies   Patient has no known allergies.   Review of Systems Review of Systems   Physical Exam Triage Vital Signs ED Triage Vitals  Enc Vitals Group     BP 03/09/18 1311 134/78     Pulse Rate 03/09/18 1311 (!) 101     Resp --      Temp 03/09/18 1311 (!) 100.5 F (38.1 C)     Temp Source 03/09/18 1311 Oral     SpO2 03/09/18 1311 94 %     Weight --      Height --      Head Circumference --      Peak Flow --  Pain Score 03/09/18 1308 2     Pain Loc --      Pain Edu? --      Excl. in [place]? --    No data found.  Updated Vital Signs BP 134/78 (BP Location: Left Arm)   Pulse (!) 101   Temp (!) 100.5 F (38.1 C) (Oral)   LMP  (Approximate) Comment: per pt about 1 year ago  SpO2 94%   Visual Acuity Right Eye Distance:   Left Eye Distance:   Bilateral Distance:    Right Eye Near:   Left Eye Near:    Bilateral Near:     Physical Exam  Constitutional: She is oriented to person, place, and time. She appears well-developed and well-nourished. No distress.  HENT:  Head: Normocephalic and atraumatic.  Right Ear: Tympanic membrane, external ear and ear canal normal.  Left Ear: Tympanic membrane, external ear and ear canal normal.  Nose: Nose normal.  Mouth/Throat: Uvula is midline and mucous membranes are normal. Posterior oropharyngeal erythema present. Tonsillar exudate.  Eyes: Pupils are equal, round, and reactive  to light. Conjunctivae and EOM are normal.  Cardiovascular: Normal rate, regular rhythm and normal heart sounds.  Pulmonary/Chest: Effort normal and breath sounds normal.  Lymphadenopathy:    She has cervical adenopathy.  Neurological: She is alert and oriented to person, place, and time.  Skin: Skin is warm and dry.     UC Treatments / Results  Labs (all labs ordered are listed, but only abnormal results are displayed) Labs Reviewed  POCT RAPID STREP A - Abnormal; Notable for the following components:      Result Value   Streptococcus, Group A Screen (Direct) POSITIVE (*)    All other components within normal limits    EKG None  Radiology No results found.  Procedures Procedures (including critical care time)  Medications Ordered in UC Medications  acetaminophen (TYLENOL) tablet 650 mg (650 mg Oral Given 03/09/18 1321)    Initial Impression / Assessment and Plan / UC Course  I have reviewed the triage vital signs and the nursing notes.  Pertinent labs & imaging results that were available during my care of the patient were reviewed by me and considered in my medical decision making (see chart for details).     Positive rapid strep. Course of amoxicillin initiated. Push fluids. Return precautions provided. Patient verbalized understanding and agreeable to plan.    Final Clinical Impressions(s) / UC Diagnoses   Final diagnoses:  Strep pharyngitis     Discharge Instructions     Push fluids to ensure adequate hydration and keep secretions thin.  Tylenol and/or ibuprofen as needed for pain or fevers.  Complete course of antibiotics.  Considered contagious for 24 hours after antibiotics started. Change out toothbrush in 24 hours. If symptoms worsen or do not improve in the next week to return to be seen or to follow up with your PCP.     ED Prescriptions    Medication Sig Dispense Auth. Provider   amoxicillin (AMOXIL) 500 MG capsule Take 1 capsule (500 mg total)  by mouth 2 (two) times daily for 10 days. 20 capsule Zigmund Gottron, NP     Controlled Substance Prescriptions [place] Controlled Substance Registry consulted? Not Applicable   Zigmund Gottron, NP 03/09/18 1335

## 2018-03-09 NOTE — Discharge Instructions (Signed)
Push fluids to ensure adequate hydration and keep secretions thin.  Tylenol and/or ibuprofen as needed for pain or fevers.  Complete course of antibiotics.  Considered contagious for 24 hours after antibiotics started. Change out toothbrush in 24 hours. If symptoms worsen or do not improve in the next week to return to be seen or to follow up with your PCP.

## 2018-03-09 NOTE — ED Triage Notes (Addendum)
Per pt she is having nasal congestion, ear pain, and thinks she has strep throat, fever, headaches

## 2018-03-12 ENCOUNTER — Encounter (INDEPENDENT_AMBULATORY_CARE_PROVIDER_SITE_OTHER): Payer: BLUE CROSS/BLUE SHIELD | Admitting: Ophthalmology

## 2018-03-18 NOTE — Progress Notes (Signed)
[facility] Note  03/19/2018     CHIEF COMPLAINT Patient presents for Retina Follow Up   HISTORY OF PRESENT ILLNESS: Jenny Cook is a 57 y.o. female who presents to the clinic today for:   HPI    Retina Follow Up    Patient presents with  Other.  In right eye.  Severity is moderate.  Duration of 10 days.  Since onset it is gradually improving.  I, the attending physician,  performed the HPI with the patient and updated documentation appropriately.          Comments    Pt states VA is still cloudy, but clearing up since last visit, pt denies flashes, floaters, pain or wavy vision, pt is using Latanoprost gtts OU, pts BS was 90 this AM       Last edited by Bernarda Caffey, MD on 03/19/2018  5:20 PM. (History)       Referring physician: Foye Spurling, MD [address] [place], [place] [address]  HISTORICAL INFORMATION:   Selected notes from the MEDICAL RECORD NUMBER Referred from Dr. Elliot Dally for concern of VH OU;   CURRENT MEDICATIONS: Current Outpatient Medications (Ophthalmic Drugs)  Medication Sig  . latanoprost (XALATAN) 0.005 % ophthalmic solution Place 1 drop into both eyes at bedtime.  Marland Kitchen LATANOPROST OP latanoprost 0.005 % eye drops   No current facility-administered medications for this visit.  (Ophthalmic Drugs)   Current Outpatient Medications (Other)  Medication Sig  . acetaminophen (TYLENOL) 325 MG tablet Take 650 mg by mouth every 6 (six) hours as needed.  Marland Kitchen amLODipine (NORVASC) 10 MG tablet Take 10 mg by mouth every morning.  Marland Kitchen atorvastatin (LIPITOR) 20 MG tablet   . insulin NPH-regular Human (NOVOLIN 70/30) (70-30) 100 UNIT/ML injection Novolin 70/30 U-100 Insulin  take 50u am/60u pm  . irbesartan-hydrochlorothiazide (AVALIDE) 150-12.5 MG tablet   . NOVOLOG MIX 70/30 (70-30) 100 UNIT/ML injection    Current Facility-Administered Medications (Other)  Medication Route  . Bevacizumab (AVASTIN) SOLN 1.25  mg Intravitreal      REVIEW OF SYSTEMS: ROS    Positive for: Endocrine, Cardiovascular, Eyes   Negative for: Constitutional, Gastrointestinal, Neurological, Skin, Genitourinary, Musculoskeletal, HENT, Respiratory, Psychiatric, Allergic/Imm, Heme/Lymph   Last edited by Debbrah Alar, COT on 03/19/2018  1:14 PM. (History)       ALLERGIES No Known Allergies  PAST MEDICAL HISTORY Past Medical History:  Diagnosis Date  . Diabetes mellitus   . Hypertension    Past Surgical History:  Procedure Laterality Date  . EYE SURGERY    . TUBAL LIGATION      FAMILY HISTORY Family History  Problem Relation Age of Onset  . Breast cancer Cousin 25  . Diabetes Cousin   . Diabetes Mother   . Diabetes Father   . Diabetes Sister   . Diabetes Brother   . Diabetes Maternal Aunt   . Diabetes Maternal Uncle   . Diabetes Paternal Aunt   . Diabetes Paternal Uncle     SOCIAL HISTORY Social History   Tobacco Use  . Smoking status: Never Smoker  . Smokeless tobacco: Never Used  Substance Use Topics  . Alcohol use: No  . Drug use: No         OPHTHALMIC EXAM:  Base Eye Exam    Visual Acuity (Snellen - Linear)      Right Left   Dist [place] 20/70 -2 20/50 +2   Dist ph [place]  20/50 +2 NI       Tonometry (Tonopen, 1:20 PM)      Right Left   Pressure 42 15       Tonometry #2 (Tonopen, 2:16 PM)      Right Left   Pressure 27        Tonometry #3 (Applanation, 2:18 PM)      Right Left   Pressure 20 18       Tonometry Comments   Pt squeezing and pulling away       Pupils      Dark Light Shape React APD   Right 4 2 Round Brisk None   Left 4 2 Round Brisk None       Visual Fields (Counting fingers)      Left Right    Full Full       Extraocular Movement      Right Left    Full, Ortho Full, Ortho       Neuro/Psych    Oriented x3:  Yes   Mood/Affect:  Normal       Dilation    Both eyes:  1.0% Mydriacyl, 2.5% Phenylephrine @ 1:20 PM        Slit Lamp and Fundus  Exam    Slit Lamp Exam      Right Left   Lids/Lashes Dermatochalasis - upper lid Dermatochalasis - upper lid   Conjunctiva/Sclera Nasal and temporal Pinguecula, Melanosis Nasal and temporal Pinguecula, Melanosis   Cornea Arcus, 1+ Punctate epithelial erosions Arcus, Inferior 2+ Punctate epithelial erosions   Anterior Chamber Deep and quiet Deep and quiet   Iris Round and moderately dilated to 81mm, no NVI Round and moderately dilated to 7mm, no NVI   Lens 2+ Nuclear sclerosis, 2+ Cortical cataract 2+ Nuclear sclerosis, 2-3+ Cortical cataract   Vitreous +RBC in anterior vitreous, central VH -- clearing Vitreous syneresis, VH settled inferiorly       Fundus Exam      Right Left   Disc Very hazy view, blood stained vitreous overlying disc, mild hyperemia, no obvious NV Nasal elveation, ?Early NVD   C/D Ratio 0.1 0.1   Macula grossly flat; no details visible Flat, Good foveal reflex, Retinal pigment epithelial mottling, mild Epiretinal membrane, scattered Microaneurysms and DBH, no exudate   Vessels mild attenuation Mild Vascular attenuation, mildly Tortuous, mild AV crossing changes, nasal NVE   Periphery Attached, PRP scars visible, posterior pole obscured by central VH -- clearing Attached, VH and debris settling inferiorly, 360 PRP scars, scattered DBH          IMAGING AND PROCEDURES  Imaging and Procedures for 02/11/18  OCT, Retina - OU - Both Eyes       Right Eye Quality was poor. Central Foveal Thickness: 192 (Unable to obtain). Progression has improved. Findings include no IRF, normal foveal contour, no SRF (Mild cystic changes temporal to fovea).   Left Eye Quality was good. Central Foveal Thickness: 207. Progression has been stable. Findings include normal foveal contour, no IRF, no SRF (Trace Cystic changes).   Notes *Images captured and stored on drive  Diagnosis / Impression:  OD: NFP, No IRF/SRF -- vitreous opacities consistent with VH -- improving OS: NFP, No  IRF/SRF  Clinical management:  See below  Abbreviations: NFP - Normal foveal profile. CME - cystoid macular edema. PED - pigment epithelial detachment. IRF - intraretinal fluid. SRF - subretinal fluid. EZ - ellipsoid zone. ERM - epiretinal membrane. ORA - outer retinal atrophy. ORT -  outer retinal tubulation. SRHM - subretinal hyper-reflective material         Intravitreal Injection, Pharmacologic Agent - OD - Right Eye       Time Out 03/19/2018. 2:39 PM. Confirmed correct patient, procedure, site, and patient consented.   Anesthesia Topical anesthesia was used. Anesthetic medications included Lidocaine 2%, Tetracaine 0.5%.   Procedure Preparation included 5% betadine to ocular surface, eyelid speculum. A supplied needle was used.   Injection: 1.25 mg Bevacizumab 1.25mg /0.5ml   NDC: 38756-433-29    Lot: [id number]     Expiration Date: 05/23/2018   Route: Intravitreal   Site: Right Eye   Waste: 0 mg  Post-op Post injection exam found visual acuity of at least counting fingers. The patient tolerated the procedure well. There were no complications. The patient received written and verbal post procedure care education.                 ASSESSMENT/PLAN:    ICD-10-CM   1. Vitreous hemorrhage of right eye (HCC) H43.11 Intravitreal Injection, Pharmacologic Agent - OD - Right Eye  2. 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 Vitreous hemorrhage, left eye (HCC) H43.12   4. Essential hypertension I10   5. Hypertensive retinopathy of both eyes H35.033   6. Retinal edema H35.81 OCT, Retina - OU - Both Eyes    1. Vitreous Hemorrhage OD - acute onset VH OD -- started ~02/07/18 - pt reports continued improvement since onset - likely secondary to #2 below (PDR) -- retina attached with PRP 360 - S/P IVA #1 OD (05.10.19) - on exam today, further improvement in [facility] compared to prior exam, but  still persistent centrally - recommend IVA #2 OD today (06.11.19) - pt wishes to proceed with IVA injection OD  - RBA of procedure discussed, questions answered - informed consent obtained and signed - see procedure note - will continue to follow closely - VH precautions reviewed -- minimize activities, keep head elevated, avoid ASA/NSAIDs/blood thinners as able - F/U 2 weeks for PRP fill-in  2. Proliferative diabetic retinopathy OU - The incidence, risk factors for progression, natural history and treatment options for diabetic retinopathy were discussed with patient.   - The need for close monitoring of blood glucose, blood pressure, and serum lipids, avoiding cigarette or any type of tobacco, and the need for long term follow up was also discussed with patient. - exam shows bilateral vitreous heme  - OCT today   3. Vitreous hemorrhage OS - improving and settling inferiorly   4,5. Hypertensive retinopathy OU - discussed importance of tight BP control and its role in current VH symptoms - monitor  6. Retinal edema - no retinal edema on exam or OCT    Ophthalmic Meds Ordered this visit:  No orders of the defined types were placed in this encounter.      Return in about 2 weeks (around 04/02/2018) for F/U VH OD, DFE, OCT.  There are no Patient Instructions on file for this visit.   Explained the diagnoses, plan, and follow up with the patient and they expressed understanding.  Patient expressed understanding of the importance of proper follow up care.   This document serves as a record of services personally performed by 04/15/2018, MD, PhD. It was created on their behalf by Gardiner Sleeper, OA, an ophthalmic assistant. The creation of this record is the provider's dictation and/or activities during the visit.  Electronically signed by: Ernest Mallick, OA  06.10.2019 10:41 PM   Gardiner Sleeper, M.D., Ph.D. Diseases & Surgery of the Retina and Vitreous [facility]   I have reviewed the above documentation for accuracy and completeness, and I agree with the above. Gardiner Sleeper, M.D., Ph.D. 03/20/18 10:44 PM      Abbreviations: M myopia (nearsighted); A astigmatism; H hyperopia (farsighted); P presbyopia; Mrx spectacle prescription;  CTL contact lenses; OD right eye; OS left eye; OU both eyes  XT exotropia; ET esotropia; PEK punctate epithelial keratitis; PEE punctate epithelial erosions; DES dry eye syndrome; MGD meibomian gland dysfunction; ATs artificial tears; PFAT's preservative free artificial tears; [place] nuclear sclerotic cataract; PSC posterior subcapsular cataract; ERM epi-retinal membrane; PVD posterior vitreous detachment; RD retinal detachment; DM diabetes mellitus; DR diabetic retinopathy; NPDR non-proliferative diabetic retinopathy; PDR proliferative diabetic retinopathy; CSME clinically significant macular edema; DME diabetic macular edema; dbh dot blot hemorrhages; CWS cotton wool spot; POAG primary open angle glaucoma; C/D cup-to-disc ratio; HVF humphrey visual field; GVF goldmann visual field; OCT optical coherence tomography; IOP intraocular pressure; BRVO Branch retinal vein occlusion; CRVO central retinal vein occlusion; CRAO central retinal artery occlusion; BRAO branch retinal artery occlusion; RT retinal tear; SB scleral buckle; PPV pars plana vitrectomy; VH Vitreous hemorrhage; PRP panretinal laser photocoagulation; IVK intravitreal kenalog; VMT vitreomacular traction; MH Macular hole;  NVD neovascularization of the disc; NVE neovascularization elsewhere; AREDS age related eye disease study; ARMD age related macular degeneration; POAG primary open angle glaucoma; EBMD epithelial/anterior basement membrane dystrophy; ACIOL anterior chamber intraocular lens; IOL intraocular lens; PCIOL posterior chamber intraocular lens; Phaco/IOL phacoemulsification with intraocular lens placement; [place] photorefractive keratectomy; LASIK  laser assisted in situ keratomileusis; HTN hypertension; DM diabetes mellitus; COPD chronic obstructive pulmonary disease

## 2018-03-19 ENCOUNTER — Encounter (INDEPENDENT_AMBULATORY_CARE_PROVIDER_SITE_OTHER): Payer: Self-pay | Admitting: Ophthalmology

## 2018-03-19 ENCOUNTER — Ambulatory Visit (INDEPENDENT_AMBULATORY_CARE_PROVIDER_SITE_OTHER): Payer: BLUE CROSS/BLUE SHIELD | Admitting: Ophthalmology

## 2018-03-19 DIAGNOSIS — H35033 Hypertensive retinopathy, bilateral: Secondary | ICD-10-CM | POA: Diagnosis not present

## 2018-03-19 DIAGNOSIS — I10 Essential (primary) hypertension: Secondary | ICD-10-CM | POA: Diagnosis not present

## 2018-03-19 DIAGNOSIS — E113593 Type 2 diabetes mellitus with proliferative diabetic retinopathy without macular edema, bilateral: Secondary | ICD-10-CM

## 2018-03-19 DIAGNOSIS — H4311 Vitreous hemorrhage, right eye: Secondary | ICD-10-CM | POA: Diagnosis not present

## 2018-03-19 DIAGNOSIS — H3581 Retinal edema: Secondary | ICD-10-CM | POA: Diagnosis not present

## 2018-03-19 DIAGNOSIS — H4312 Vitreous hemorrhage, left eye: Secondary | ICD-10-CM | POA: Diagnosis not present

## 2018-03-20 ENCOUNTER — Encounter (INDEPENDENT_AMBULATORY_CARE_PROVIDER_SITE_OTHER): Payer: Self-pay | Admitting: Ophthalmology

## 2018-03-20 DIAGNOSIS — H4311 Vitreous hemorrhage, right eye: Secondary | ICD-10-CM | POA: Diagnosis not present

## 2018-03-20 DIAGNOSIS — E113593 Type 2 diabetes mellitus with proliferative diabetic retinopathy without macular edema, bilateral: Secondary | ICD-10-CM | POA: Diagnosis not present

## 2018-03-20 MED ORDER — BEVACIZUMAB CHEMO INJECTION 1.25MG/0.05ML SYRINGE FOR KALEIDOSCOPE
1.2500 mg | INTRAVITREAL | Status: AC
  Administered 2018-03-20: 1.25 mg via INTRAVITREAL

## 2018-03-27 DIAGNOSIS — E1165 Type 2 diabetes mellitus with hyperglycemia: Secondary | ICD-10-CM | POA: Diagnosis not present

## 2018-03-27 DIAGNOSIS — F419 Anxiety disorder, unspecified: Secondary | ICD-10-CM | POA: Diagnosis not present

## 2018-03-27 DIAGNOSIS — E11319 Type 2 diabetes mellitus with unspecified diabetic retinopathy without macular edema: Secondary | ICD-10-CM | POA: Diagnosis not present

## 2018-03-27 DIAGNOSIS — E1142 Type 2 diabetes mellitus with diabetic polyneuropathy: Secondary | ICD-10-CM | POA: Diagnosis not present

## 2018-03-27 DIAGNOSIS — G4733 Obstructive sleep apnea (adult) (pediatric): Secondary | ICD-10-CM | POA: Diagnosis not present

## 2018-04-04 DIAGNOSIS — E11649 Type 2 diabetes mellitus with hypoglycemia without coma: Secondary | ICD-10-CM | POA: Diagnosis not present

## 2018-04-04 DIAGNOSIS — F419 Anxiety disorder, unspecified: Secondary | ICD-10-CM | POA: Diagnosis not present

## 2018-04-04 DIAGNOSIS — G4733 Obstructive sleep apnea (adult) (pediatric): Secondary | ICD-10-CM | POA: Diagnosis not present

## 2018-04-04 DIAGNOSIS — R42 Dizziness and giddiness: Secondary | ICD-10-CM | POA: Diagnosis not present

## 2018-04-04 NOTE — Progress Notes (Signed)
[facility] Note  04/05/2018     CHIEF COMPLAINT Patient presents for Retina Follow Up   HISTORY OF PRESENT ILLNESS: Jenny Cook is a 57 y.o. female who presents to the clinic today for:   HPI    Retina Follow Up    Patient presents with  Other.  In right eye.  This started 1 month ago.  Severity is mild.  Since onset it is gradually improving.  I, the attending physician,  performed the HPI with the patient and updated documentation appropriately.          Comments    F/U PDR OD. Patient states vision is "getting better". Bs this am 184, reports bs have been stable. Pt is ready for laser tx today if indicted.        Last edited by Bernarda Caffey, MD on 04/05/2018 11:11 AM. (History)       Referring physician: Foye Spurling, MD [address] [place], [place] [address]  HISTORICAL INFORMATION:   Selected notes from the MEDICAL RECORD NUMBER Referred from Dr. Elliot Dally for concern of VH OU;   CURRENT MEDICATIONS: Current Outpatient Medications (Ophthalmic Drugs)  Medication Sig  . latanoprost (XALATAN) 0.005 % ophthalmic solution Place 1 drop into both eyes at bedtime.  Marland Kitchen LATANOPROST OP latanoprost 0.005 % eye drops   No current facility-administered medications for this visit.  (Ophthalmic Drugs)   Current Outpatient Medications (Other)  Medication Sig  . acetaminophen (TYLENOL) 325 MG tablet Take 650 mg by mouth every 6 (six) hours as needed.  Marland Kitchen amLODipine (NORVASC) 10 MG tablet Take 10 mg by mouth every morning.  Marland Kitchen atorvastatin (LIPITOR) 20 MG tablet   . insulin NPH-regular Human (NOVOLIN 70/30) (70-30) 100 UNIT/ML injection Novolin 70/30 U-100 Insulin  take 50u am/60u pm  . irbesartan-hydrochlorothiazide (AVALIDE) 150-12.5 MG tablet   . NOVOLOG MIX 70/30 (70-30) 100 UNIT/ML injection    Current Facility-Administered Medications (Other)  Medication Route  . Bevacizumab (AVASTIN) SOLN 1.25 mg Intravitreal  .  Bevacizumab (AVASTIN) SOLN 1.25 mg Intravitreal      REVIEW OF SYSTEMS: ROS    Positive for: Endocrine, Eyes   Negative for: Constitutional, Gastrointestinal, Neurological, Skin, Genitourinary, Musculoskeletal, HENT, Cardiovascular, Respiratory, Psychiatric, Allergic/Imm, Heme/Lymph   Last edited by Zenovia Jordan, LPN on 2/29/7989 21:19 AM. (History)       ALLERGIES No Known Allergies  PAST MEDICAL HISTORY Past Medical History:  Diagnosis Date  . Diabetes mellitus   . Hypertension    Past Surgical History:  Procedure Laterality Date  . EYE SURGERY    . TUBAL LIGATION      FAMILY HISTORY Family History  Problem Relation Age of Onset  . Breast cancer Cousin 25  . Diabetes Cousin   . Diabetes Mother   . Diabetes Father   . Diabetes Sister   . Diabetes Brother   . Diabetes Maternal Aunt   . Diabetes Maternal Uncle   . Diabetes Paternal Aunt   . Diabetes Paternal Uncle     SOCIAL HISTORY Social History   Tobacco Use  . Smoking status: Never Smoker  . Smokeless tobacco: Never Used  Substance Use Topics  . Alcohol use: No  . Drug use: No         OPHTHALMIC EXAM:  Base Eye Exam    Visual Acuity (Snellen - Linear)      Right Left   Dist [place] 20/70 20/50   Dist ph [place]  20/40 NI       Tonometry (Tonopen, 10:39 AM)      Right Left   Pressure 20 18       Pupils      Dark Light Shape React APD   Right 5 4 Round Brisk None   Left 5 4 Round Brisk None       Visual Fields (Counting fingers)      Left Right    Full Full       Extraocular Movement      Right Left    Full Full       Neuro/Psych    Oriented x3:  Yes   Mood/Affect:  Normal       Dilation    Both eyes:  1.0% Mydriacyl, Paremyd @ 10:39 AM        Slit Lamp and Fundus Exam    Slit Lamp Exam      Right Left   Lids/Lashes Dermatochalasis - upper lid Dermatochalasis - upper lid   Conjunctiva/Sclera Nasal and temporal Pinguecula, Melanosis Nasal and temporal Pinguecula, Melanosis    Cornea Arcus, 1+ Punctate epithelial erosions Arcus, Inferior 2+ Punctate epithelial erosions   Anterior Chamber Deep and quiet Deep and quiet   Iris Round and moderately dilated to 75mm, no NVI Round and moderately dilated to 4mm, no NVI   Lens 2+ Nuclear sclerosis, 2+ Cortical cataract 2+ Nuclear sclerosis, 2-3+ Cortical cataract   Vitreous +RBC in anterior vitreous, central VH -- clearing Vitreous syneresis, VH settled inferiorly       Fundus Exam      Right Left   Disc mild hyperemia, no obvious NV, Sharp Nasal elveation, ?Early NVD, Pink and Sharp   C/D Ratio 0.1 0.1   Macula Good foveal reflex, Retinal pigment epithelial mottling, mild cystic changes; scattered MA Flat, Good foveal reflex, Retinal pigment epithelial mottling, mild Epiretinal membrane, scattered Microaneurysms and DBH, no exudate   Vessels mild attenuation Mild Vascular attenuation, mildly Tortuous, mild AV crossing changes, nasal NVE   Periphery Attached, PRP scars 360  Attached, VH and debris settled inferiorly, 360 PRP scars, scattered DBH          IMAGING AND PROCEDURES  Imaging and Procedures for 02/11/18  OCT, Retina - OU - Both Eyes       Right Eye Quality was borderline. Central Foveal Thickness: 190. Progression has been stable. Findings include normal foveal contour, no SRF, intraretinal fluid (Mild interval increase in cystic changes temporal to fovea).   Left Eye Quality was good. Central Foveal Thickness: 204. Progression has been stable. Findings include normal foveal contour, no IRF, no SRF (Trace Cystic changes).   Notes *Images captured and stored on drive  Diagnosis / Impression:  OD: NFP, No IRF/SRF -- vitreous opacities consistent with VH -- improving OS: NFP, No IRF/SRF  Clinical management:  See below  Abbreviations: NFP - Normal foveal profile. CME - cystoid macular edema. PED - pigment epithelial detachment. IRF - intraretinal fluid. SRF - subretinal fluid. EZ - ellipsoid zone.  ERM - epiretinal membrane. ORA - outer retinal atrophy. ORT - outer retinal tubulation. SRHM - subretinal hyper-reflective material                  ASSESSMENT/PLAN:    ICD-10-CM   1. Vitreous hemorrhage of right eye (HCC) H43.11 CANCELED: Fluorescein Angiography Optos (Transit OS)  2. 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 Vitreous hemorrhage, left eye (HCC) H43.12   4. Essential hypertension I10   5. Hypertensive retinopathy of both eyes H35.033   6. Retinal edema H35.81 OCT, Retina - OU - Both Eyes    CANCELED: Fluorescein Angiography Optos (Transit OS)    1. Vitreous Hemorrhage OD - acute onset VH OD -- started ~02/07/18 - pt reports continued improvement since onset - likely secondary to #2 below (PDR) -- retina attached with PRP 360 - S/P IVA #1 OD (05.10.19), #2 (06.11.19) - on exam today, further improvement in [place] compared to prior exam - recommended FA today, but unable to get IV access for injection of dye - recommend return in 1-2 wks, advised pt to hydrate well, drink plenty of water - will continue to follow closely and monitor FA for need for additional laser - VH precautions reviewed -- minimize activities, keep head elevated, avoid ASA/NSAIDs/blood thinners as able  2. Proliferative diabetic retinopathy OU - The incidence, risk factors for progression, natural history and treatment options for diabetic retinopathy were discussed with patient.   - The need for close monitoring of blood glucose, blood pressure, and serum lipids, avoiding cigarette or any type of tobacco, and the need for long term follow up was also discussed with patient. - exam shows bilateral vitreous heme -- OD clearing - OCT today with cystic changes OU  3. Vitreous hemorrhage OS -  improving and settling inferiorly  4,5. Hypertensive retinopathy  OU - discussed importance of tight BP control and its role in current VH symptoms - monitor  6. Retinal edema - no retinal edema on exam or OCT    Ophthalmic Meds Ordered this visit:  No orders of the defined types were placed in this encounter.      Return 1-2 wks; repeat FA, possible laser.  There are no Patient Instructions on file for this visit.   Explained the diagnoses, plan, and follow up with the patient and they expressed understanding.  Patient expressed understanding of the importance of proper follow up care.   This document serves as a record of services personally performed by Gardiner Sleeper, MD, PhD. It was created on their behalf by Ernest Mallick, OA, an ophthalmic assistant. The creation of this record is the provider's dictation and/or activities during the visit.    Electronically signed by: Ernest Mallick, OA  06.27.2019 12:27 PM   This document serves as a record of services personally performed by Gardiner Sleeper, MD, PhD. It was created on their behalf by Catha Brow, [place], a certified ophthalmic assistant. The creation of this record is the provider's dictation and/or activities during the visit.  Electronically signed by: Catha Brow, [place]  06.28.19 12:27 PM    Gardiner Sleeper, M.D., Ph.D. Diseases & Surgery of the Retina and Vitreous [facility]    I have reviewed the above documentation for accuracy and completeness, and I agree with the above. Gardiner Sleeper, M.D., Ph.D. 04/05/18 12:27 PM      Abbreviations: M myopia (nearsighted); A astigmatism; H hyperopia (farsighted); P presbyopia; Mrx spectacle prescription;  CTL contact lenses; OD right eye; OS left eye; OU both eyes  XT exotropia; ET esotropia; PEK punctate epithelial keratitis; PEE punctate epithelial erosions; DES dry eye syndrome; MGD meibomian gland dysfunction; ATs artificial tears; PFAT's preservative free artificial tears; [place] nuclear sclerotic  cataract; PSC posterior subcapsular cataract; ERM epi-retinal membrane; PVD posterior vitreous detachment; RD retinal detachment; DM diabetes  mellitus; DR diabetic retinopathy; NPDR non-proliferative diabetic retinopathy; PDR proliferative diabetic retinopathy; CSME clinically significant macular edema; DME diabetic macular edema; dbh dot blot hemorrhages; CWS cotton wool spot; POAG primary open angle glaucoma; C/D cup-to-disc ratio; HVF humphrey visual field; GVF goldmann visual field; OCT optical coherence tomography; IOP intraocular pressure; BRVO Branch retinal vein occlusion; CRVO central retinal vein occlusion; CRAO central retinal artery occlusion; BRAO branch retinal artery occlusion; RT retinal tear; SB scleral buckle; PPV pars plana vitrectomy; VH Vitreous hemorrhage; PRP panretinal laser photocoagulation; IVK intravitreal kenalog; VMT vitreomacular traction; MH Macular hole;  NVD neovascularization of the disc; NVE neovascularization elsewhere; AREDS age related eye disease study; ARMD age related macular degeneration; POAG primary open angle glaucoma; EBMD epithelial/anterior basement membrane dystrophy; ACIOL anterior chamber intraocular lens; IOL intraocular lens; PCIOL posterior chamber intraocular lens; Phaco/IOL phacoemulsification with intraocular lens placement; [place] photorefractive keratectomy; LASIK laser assisted in situ keratomileusis; HTN hypertension; DM diabetes mellitus; COPD chronic obstructive pulmonary disease

## 2018-04-05 ENCOUNTER — Ambulatory Visit (INDEPENDENT_AMBULATORY_CARE_PROVIDER_SITE_OTHER): Payer: BLUE CROSS/BLUE SHIELD | Admitting: Ophthalmology

## 2018-04-05 ENCOUNTER — Encounter (INDEPENDENT_AMBULATORY_CARE_PROVIDER_SITE_OTHER): Payer: Self-pay | Admitting: Ophthalmology

## 2018-04-05 DIAGNOSIS — H4311 Vitreous hemorrhage, right eye: Secondary | ICD-10-CM | POA: Diagnosis not present

## 2018-04-05 DIAGNOSIS — E113593 Type 2 diabetes mellitus with proliferative diabetic retinopathy without macular edema, bilateral: Secondary | ICD-10-CM | POA: Diagnosis not present

## 2018-04-05 DIAGNOSIS — H4312 Vitreous hemorrhage, left eye: Secondary | ICD-10-CM | POA: Diagnosis not present

## 2018-04-05 DIAGNOSIS — H35033 Hypertensive retinopathy, bilateral: Secondary | ICD-10-CM | POA: Diagnosis not present

## 2018-04-05 DIAGNOSIS — H3581 Retinal edema: Secondary | ICD-10-CM | POA: Diagnosis not present

## 2018-04-05 DIAGNOSIS — I10 Essential (primary) hypertension: Secondary | ICD-10-CM

## 2018-04-16 NOTE — Progress Notes (Signed)
[facility] Note  04/19/2018     CHIEF COMPLAINT Patient presents for Retina Follow Up   HISTORY OF PRESENT ILLNESS: Jenny Cook is a 57 y.o. female who presents to the clinic today for:   HPI    Retina Follow Up    Patient presents with  Other.  In right eye.  Severity is moderate.  Duration of 2 weeks.  Since onset it is stable.  I, the attending physician,  performed the HPI with the patient and updated documentation appropriately.          Comments    Pt presents for vitreous hemorrhage OD f/u and FA, pt states her VA is getting better every day, pt denies flashes, floater, wavy vision and pain, pt uses Latanoprost gtts, pts BS was 96 this morning       Last edited by Bernarda Caffey, MD on 04/19/2018 10:42 AM. (History)       Referring physician: Foye Spurling, MD No address on file  HISTORICAL INFORMATION:   Selected notes from the MEDICAL RECORD NUMBER Referred from Dr. Elliot Dally for concern of VH OU;   CURRENT MEDICATIONS: Current Outpatient Medications (Ophthalmic Drugs)  Medication Sig  . latanoprost (XALATAN) 0.005 % ophthalmic solution Place 1 drop into both eyes at bedtime.  Marland Kitchen LATANOPROST OP latanoprost 0.005 % eye drops  . prednisoLONE acetate (PRED FORTE) 1 % ophthalmic suspension Place 1 drop into the left eye 4 (four) times daily for 7 days.   No current facility-administered medications for this visit.  (Ophthalmic Drugs)   Current Outpatient Medications (Other)  Medication Sig  . acetaminophen (TYLENOL) 325 MG tablet Take 650 mg by mouth every 6 (six) hours as needed.  Marland Kitchen amLODipine (NORVASC) 10 MG tablet Take 10 mg by mouth every morning.  Marland Kitchen atorvastatin (LIPITOR) 20 MG tablet   . insulin NPH-regular Human (NOVOLIN 70/30) (70-30) 100 UNIT/ML injection Novolin 70/30 U-100 Insulin  take 50u am/60u pm  . irbesartan-hydrochlorothiazide (AVALIDE) 150-12.5 MG tablet   . NOVOLOG MIX 70/30 (70-30) 100 UNIT/ML injection     Current Facility-Administered Medications (Other)  Medication Route  . Bevacizumab (AVASTIN) SOLN 1.25 mg Intravitreal  . Bevacizumab (AVASTIN) SOLN 1.25 mg Intravitreal      REVIEW OF SYSTEMS: ROS    Positive for: Endocrine, Eyes   Negative for: Constitutional, Gastrointestinal, Neurological, Skin, Genitourinary, Musculoskeletal, HENT, Cardiovascular, Respiratory, Psychiatric, Allergic/Imm, Heme/Lymph   Last edited by Debbrah Alar, COT on 04/19/2018  9:18 AM. (History)       ALLERGIES No Known Allergies  PAST MEDICAL HISTORY Past Medical History:  Diagnosis Date  . Diabetes mellitus   . Hypertension    Past Surgical History:  Procedure Laterality Date  . EYE SURGERY    . TUBAL LIGATION      FAMILY HISTORY Family History  Problem Relation Age of Onset  . Breast cancer Cousin 25  . Diabetes Cousin   . Diabetes Mother   . Diabetes Father   . Diabetes Sister   . Diabetes Brother   . Diabetes Maternal Aunt   . Diabetes Maternal Uncle   . Diabetes Paternal Aunt   . Diabetes Paternal Uncle     SOCIAL HISTORY Social History   Tobacco Use  . Smoking status: Never Smoker  . Smokeless tobacco: Never Used  Substance Use Topics  . Alcohol use: No  . Drug use: No         OPHTHALMIC EXAM:  Base  Eye Exam    Visual Acuity (Snellen - Linear)      Right Left   Dist [place] 20/40 -2 20/50 -1   Dist ph [place] 20/30 -1 20/50 +2       Tonometry (Tonopen, 9:25 AM)      Right Left   Pressure 30 25       Tonometry #2 (Tonopen, 9:25 AM)      Right Left   Pressure 23 23       Pupils      Dark Light Shape React APD   Right 5 3 Round Brisk None   Left 5 3 Round Brisk None       Visual Fields (Counting fingers)      Left Right    Full Full       Extraocular Movement      Right Left    Full, Ortho Full, Ortho       Neuro/Psych    Oriented x3:  Yes   Mood/Affect:  Normal       Dilation    Both eyes:  1.0% Mydriacyl, 2.5% Phenylephrine @ 9:25 AM         Slit Lamp and Fundus Exam    Slit Lamp Exam      Right Left   Lids/Lashes Dermatochalasis - upper lid Dermatochalasis - upper lid   Conjunctiva/Sclera Nasal and temporal Pinguecula, Melanosis Nasal and temporal Pinguecula, Melanosis   Cornea Arcus, 1+ Punctate epithelial erosions Arcus, Inferior 2+ Punctate epithelial erosions   Anterior Chamber Deep and quiet Deep and quiet   Iris Round and moderately dilated to 76mm, no NVI Round and moderately dilated to 88mm, no NVI   Lens 2+ Nuclear sclerosis, 3+ Cortical cataract 2+ Nuclear sclerosis, 2-3+ Cortical cataract   Vitreous +RBC in anterior vitreous, central VH -- clearing, Posterior vitreous detachment Vitreous syneresis, VH settled inferiorly       Fundus Exam      Right Left   Disc Pink and Sharp Nasal elveation, ?Early NVD, Pink and Sharp   C/D Ratio 0.1 0.1   Macula Good foveal reflex, Retinal pigment epithelial mottling, mild cystic changes; scattered MA Flat, Good foveal reflex, Retinal pigment epithelial mottling, mild Epiretinal membrane, scattered Microaneurysms and DBH, no exudate   Vessels mild attenuation Mild Vascular attenuation, mildly Tortuous, mild AV crossing changes, nasal NVE   Periphery Attached, PRP scars 360  Attached, VH and debris settled inferiorly, 360 PRP scars -- room for fill in esp nasally, scattered DBH          IMAGING AND PROCEDURES  Imaging and Procedures for 02/11/18  OCT, Retina - OU - Both Eyes       Right Eye Quality was borderline. Central Foveal Thickness: 188. Progression has improved. Findings include normal foveal contour, no SRF, intraretinal fluid (Interval improvment in cystic changes temporal to fovea).   Left Eye Quality was good. Central Foveal Thickness: 188. Progression has been stable. Findings include normal foveal contour, no IRF, no SRF (Trace Cystic changes).   Notes *Images captured and stored on drive  Diagnosis / Impression:  OD: NFP, No IRF/SRF -- vitreous  opacities consistent with VH -- improving OS: NFP, No IRF/SRF  Clinical management:  See below  Abbreviations: NFP - Normal foveal profile. CME - cystoid macular edema. PED - pigment epithelial detachment. IRF - intraretinal fluid. SRF - subretinal fluid. EZ - ellipsoid zone. ERM - epiretinal membrane. ORA - outer retinal atrophy. ORT - outer retinal tubulation. SRHM -  subretinal hyper-reflective material         Fluorescein Angiography Optos (Transit OD)       Right Eye Progression has no prior data. Early phase findings include staining, blockage. Mid/Late phase findings include blockage, staining.   Left Eye Progression has no prior data. Early phase findings include leakage, staining, blockage. Mid/Late phase findings include leakage, staining, blockage.   Notes *Images captured and stored on drive;   Impression: OD: no NV OS: scattered areas of hyperfluorescent leakage consistent with active NV         Panretinal Photocoagulation - OS - Left Eye       LASER PROCEDURE NOTE  Diagnosis:   Proliferative Diabetic Retinopathy, LEFT EYE  Procedure:  Pan-retinal photocoagulation using slit lamp laser, LEFT EYE, fill-in  Anesthesia:  Topical  Surgeon: Bernarda Caffey, MD, PhD   Informed consent obtained, operative eye marked, and time out performed prior to initiation of laser.   Lumenis [id number] slit lamp laser Pattern: 3x3 square Power: 300 mW Duration: 50 msec  Spot size: 300 microns  # spots: 1043 spots 360 fill-in, greatest nasally  Complications: None.  Notes: significant vitreous heme and cortical cataract obscuring view and preventing laser up take inferiorly and scattered focal areas  RTC: 4 wks for PRP fill in, contralateral eye  Patient tolerated the procedure well and received written and verbal post-procedure care information/education.                  ASSESSMENT/PLAN:    ICD-10-CM   1. Vitreous hemorrhage of right eye (HCC) H43.11  OCT, Retina - OU - Both Eyes    Fluorescein Angiography Optos (Transit OD)  2. 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 Vitreous hemorrhage, left eye (HCC) H43.12 Panretinal Photocoagulation - OS - Left Eye  4. Essential hypertension I10   5. Hypertensive retinopathy of both eyes H35.033 Fluorescein Angiography Optos (Transit OD)  6. Retinal edema H35.81 OCT, Retina - OU - Both Eyes    1. Vitreous Hemorrhage OD - acute onset VH OD -- started ~02/07/18 - pt reports continued improvement since onset - likely secondary to #2 below (PDR) -- retina attached with PRP 360 - S/P IVA #1 OD (05.10.19), #2 (06.11.19) - on exam today, further improvement in [facility] compared to prior exam - VH precautions reviewed -- minimize activities, keep head elevated, avoid ASA/NSAIDs/blood thinners as able - F/U 4 weeks - will plan for fill-in OD  2. Proliferative diabetic retinopathy OU - The incidence, risk factors for progression, natural history and treatment options for diabetic retinopathy were discussed with patient.   - The need for close monitoring of blood glucose, blood pressure, and serum lipids, avoiding cigarette or any type of tobacco, and the need for long term follow up was also discussed with patient. - s/p PRP OU - exam shows bilateral vitreous heme -- OD clearing - FA on 07.12.19 shows active NV OS with leakage - recommend PRP fill-in OS today (07.12.19) - pt wishes to proceed - RBA of procedure discussed, questions answered - informed consent obtained and signed - see procedure note - will continue to follow closely and monitor FA for need for additional laser - f/u 4 wks  3. Vitreous hemorrhage OS - improving and settling inferiorly  4,5. Hypertensive retinopathy OU - discussed importance of tight BP control and  its role in current VH symptoms - monitor  6. Retinal edema - No retinal edema on exam or OCT    Ophthalmic Meds Ordered this visit:  Meds ordered this encounter  Medications  . prednisoLONE acetate (PRED FORTE) 1 % ophthalmic suspension    Sig: Place 1 drop into the left eye 4 (four) times daily for 7 days.    Dispense:  10 mL    Refill:  0       Return in about 1 month (around 05/17/2018) for F/U VH OD, DFE, OCT, PRP OD.  There are no Patient Instructions on file for this visit.   Explained the diagnoses, plan, and follow up with the patient and they expressed understanding.  Patient expressed understanding of the importance of proper follow up care.   This document serves as a record of services personally performed by Gardiner Sleeper, MD, PhD. It was created on their behalf by Catha Brow, [place], a certified ophthalmic assistant. The creation of this record is the provider's dictation and/or activities during the visit.  Electronically signed by: Catha Brow, COA  07.09.19 9:46 AM   Gardiner Sleeper, M.D., Ph.D. Diseases & Surgery of the Retina and Vitreous [facility]  I have reviewed the above documentation for accuracy and completeness, and I agree with the above. Gardiner Sleeper, M.D., Ph.D. 04/22/18 9:52 AM    Abbreviations: M myopia (nearsighted); A astigmatism; H hyperopia (farsighted); P presbyopia; Mrx spectacle prescription;  CTL contact lenses; OD right eye; OS left eye; OU both eyes  XT exotropia; ET esotropia; PEK punctate epithelial keratitis; PEE punctate epithelial erosions; DES dry eye syndrome; MGD meibomian gland dysfunction; ATs artificial tears; PFAT's preservative free artificial tears; [place] nuclear sclerotic cataract; PSC posterior subcapsular cataract; ERM epi-retinal membrane; PVD posterior vitreous detachment; RD retinal detachment; DM diabetes mellitus; DR diabetic retinopathy; NPDR non-proliferative diabetic retinopathy;  PDR proliferative diabetic retinopathy; CSME clinically significant macular edema; DME diabetic macular edema; dbh dot blot hemorrhages; CWS cotton wool spot; POAG primary open angle glaucoma; C/D cup-to-disc ratio; HVF humphrey visual field; GVF goldmann visual field; OCT optical coherence tomography; IOP intraocular pressure; BRVO Branch retinal vein occlusion; CRVO central retinal vein occlusion; CRAO central retinal artery occlusion; BRAO branch retinal artery occlusion; RT retinal tear; SB scleral buckle; PPV pars plana vitrectomy; VH Vitreous hemorrhage; PRP panretinal laser photocoagulation; IVK intravitreal kenalog; VMT vitreomacular traction; MH Macular hole;  NVD neovascularization of the disc; NVE neovascularization elsewhere; AREDS age related eye disease study; ARMD age related macular degeneration; POAG primary open angle glaucoma; EBMD epithelial/anterior basement membrane dystrophy; ACIOL anterior chamber intraocular lens; IOL intraocular lens; PCIOL posterior chamber intraocular lens; Phaco/IOL phacoemulsification with intraocular lens placement; [place] photorefractive keratectomy; LASIK laser assisted in situ keratomileusis; HTN hypertension; DM diabetes mellitus; COPD chronic obstructive pulmonary disease

## 2018-04-19 ENCOUNTER — Encounter (INDEPENDENT_AMBULATORY_CARE_PROVIDER_SITE_OTHER): Payer: Self-pay | Admitting: Ophthalmology

## 2018-04-19 ENCOUNTER — Ambulatory Visit (INDEPENDENT_AMBULATORY_CARE_PROVIDER_SITE_OTHER): Payer: BLUE CROSS/BLUE SHIELD | Admitting: Ophthalmology

## 2018-04-19 DIAGNOSIS — H3581 Retinal edema: Secondary | ICD-10-CM | POA: Diagnosis not present

## 2018-04-19 DIAGNOSIS — H35033 Hypertensive retinopathy, bilateral: Secondary | ICD-10-CM | POA: Diagnosis not present

## 2018-04-19 DIAGNOSIS — E113593 Type 2 diabetes mellitus with proliferative diabetic retinopathy without macular edema, bilateral: Secondary | ICD-10-CM | POA: Diagnosis not present

## 2018-04-19 DIAGNOSIS — I10 Essential (primary) hypertension: Secondary | ICD-10-CM

## 2018-04-19 DIAGNOSIS — H4311 Vitreous hemorrhage, right eye: Secondary | ICD-10-CM

## 2018-04-19 DIAGNOSIS — H4312 Vitreous hemorrhage, left eye: Secondary | ICD-10-CM

## 2018-04-19 MED ORDER — PREDNISOLONE ACETATE 1 % OP SUSP: 1.0000 [drp] | Freq: Four times a day (QID) | OPHTHALMIC | 0 refills | Status: AC

## 2018-04-22 ENCOUNTER — Encounter (INDEPENDENT_AMBULATORY_CARE_PROVIDER_SITE_OTHER): Payer: Self-pay | Admitting: Ophthalmology

## 2018-04-23 ENCOUNTER — Other Ambulatory Visit (HOSPITAL_BASED_OUTPATIENT_CLINIC_OR_DEPARTMENT_OTHER): Payer: Self-pay

## 2018-04-23 DIAGNOSIS — R5383 Other fatigue: Secondary | ICD-10-CM

## 2018-04-23 DIAGNOSIS — G4710 Hypersomnia, unspecified: Secondary | ICD-10-CM

## 2018-04-23 DIAGNOSIS — G4700 Insomnia, unspecified: Secondary | ICD-10-CM

## 2018-04-30 ENCOUNTER — Ambulatory Visit: Payer: BLUE CROSS/BLUE SHIELD | Admitting: Obstetrics

## 2018-05-01 ENCOUNTER — Ambulatory Visit (INDEPENDENT_AMBULATORY_CARE_PROVIDER_SITE_OTHER): Payer: BLUE CROSS/BLUE SHIELD | Admitting: Obstetrics

## 2018-05-01 ENCOUNTER — Encounter: Payer: Self-pay | Admitting: Obstetrics

## 2018-05-01 VITALS — BP 112/70 | HR 83 | Ht 66.0 in | Wt 250.4 lb

## 2018-05-01 DIAGNOSIS — N898 Other specified noninflammatory disorders of vagina: Secondary | ICD-10-CM

## 2018-05-01 DIAGNOSIS — Z1151 Encounter for screening for human papillomavirus (HPV): Secondary | ICD-10-CM

## 2018-05-01 DIAGNOSIS — Z01419 Encounter for gynecological examination (general) (routine) without abnormal findings: Secondary | ICD-10-CM

## 2018-05-01 DIAGNOSIS — Z124 Encounter for screening for malignant neoplasm of cervix: Secondary | ICD-10-CM | POA: Diagnosis not present

## 2018-05-01 DIAGNOSIS — Z113 Encounter for screening for infections with a predominantly sexual mode of transmission: Secondary | ICD-10-CM | POA: Diagnosis not present

## 2018-05-01 NOTE — Progress Notes (Signed)
Patient is in the office for annual exam. Pt reports last pap 2 years ago. Mammogram 09-10-17. Pt declines std testing, but requests testing for cvag and bvag.

## 2018-05-01 NOTE — Patient Instructions (Signed)
Menopause Menopause is the normal time of life when menstrual periods stop completely. Menopause is complete when you have missed 12 consecutive menstrual periods. It usually occurs between the ages of 48 years and 55 years. Very rarely does a woman develop menopause before the age of 40 years. At menopause, your ovaries stop producing the female hormones estrogen and progesterone. This can cause undesirable symptoms and also affect your health. Sometimes the symptoms may occur 4-5 years before the menopause begins. There is no relationship between menopause and:  Oral contraceptives.  Number of children you had.  Race.  The age your menstrual periods started (menarche).  Heavy smokers and very thin women may develop menopause earlier in life. What are the causes?  The ovaries stop producing the female hormones estrogen and progesterone. Other causes include:  Surgery to remove both ovaries.  The ovaries stop functioning for no known reason.  Tumors of the pituitary gland in the brain.  Medical disease that affects the ovaries and hormone production.  Radiation treatment to the abdomen or pelvis.  Chemotherapy that affects the ovaries.  What are the signs or symptoms?  Hot flashes.  Night sweats.  Decrease in sex drive.  Vaginal dryness and thinning of the vagina causing painful intercourse.  Dryness of the skin and developing wrinkles.  Headaches.  Tiredness.  Irritability.  Memory problems.  Weight gain.  Bladder infections.  Hair growth of the face and chest.  Infertility. More serious symptoms include:  Loss of bone (osteoporosis) causing breaks (fractures).  Depression.  Hardening and narrowing of the arteries (atherosclerosis) causing heart attacks and strokes.  How is this diagnosed?  When the menstrual periods have stopped for 12 straight months.  Physical exam.  Hormone studies of the blood. How is this treated? There are many treatment  choices and nearly as many questions about them. The decisions to treat or not to treat menopausal changes is an individual choice made with your health care provider. Your health care provider can discuss the treatments with you. Together, you can decide which treatment will work best for you. Your treatment choices may include:  Hormone therapy (estrogen and progesterone).  Non-hormonal medicines.  Treating the individual symptoms with medicine (for example antidepressants for depression).  Herbal medicines that may help specific symptoms.  Counseling by a psychiatrist or psychologist.  Group therapy.  Lifestyle changes including: ? Eating healthy. ? Regular exercise. ? Limiting caffeine and alcohol. ? Stress management and meditation.  No treatment.  Follow these instructions at home:  Take the medicine your health care provider gives you as directed.  Get plenty of sleep and rest.  Exercise regularly.  Eat a diet that contains calcium (good for the bones) and soy products (acts like estrogen hormone).  Avoid alcoholic beverages.  Do not smoke.  If you have hot flashes, dress in layers.  Take supplements, calcium, and vitamin D to strengthen bones.  You can use over-the-counter lubricants or moisturizers for vaginal dryness.  Group therapy is sometimes very helpful.  Acupuncture may be helpful in some cases. Contact a health care provider if:  You are not sure you are in menopause.  You are having menopausal symptoms and need advice and treatment.  You are still having menstrual periods after age 55 years.  You have pain with intercourse.  Menopause is complete (no menstrual period for 12 months) and you develop vaginal bleeding.  You need a referral to a specialist (gynecologist, psychiatrist, or psychologist) for treatment. Get help right   away if:  You have severe depression.  You have excessive vaginal bleeding.  You fell and think you have a  broken bone.  You have pain when you urinate.  You develop leg or chest pain.  You have a fast pounding heart beat (palpitations).  You have severe headaches.  You develop vision problems.  You feel a lump in your breast.  You have abdominal pain or severe indigestion. This information is not intended to replace advice given to you by your health care provider. Make sure you discuss any questions you have with your health care provider. Document Released: 12/16/2003 Document Revised: 03/02/2016 Document Reviewed: 04/24/2013 Elsevier Interactive Patient Education  2017 [place]. Menopause and Herbal Products What is menopause? Menopause is the normal time of life when menstrual periods decrease in frequency and eventually stop completely. This process can take several years for some women. Menopause is complete when you have had an absence of menstruation for a full year since your last menstrual period. It usually occurs between the ages of 10 and 68. It is not common for menopause to begin before the age of 8. During menopause, your body stops producing the female hormones estrogen and progesterone. Common symptoms associated with this loss of hormones (vasomotor symptoms) are:  Hot flashes.  Hot flushes.  Night sweats.  Other common symptoms and complications of menopause include:  Decrease in sex drive.  Vaginal dryness and thinning of the walls of the vagina. This can make sex painful.  Dryness of the skin and development of wrinkles.  Headaches.  Tiredness.  Irritability.  Memory problems.  Weight gain.  Bladder infections.  Hair growth on the face and chest.  Inability to reproduce offspring (infertility).  Loss of density in the bones (osteoporosis) increasing your risk for breaks (fractures).  Depression.  Hardening and narrowing of the arteries (atherosclerosis). This increases your risk of heart attack and stroke.  What treatment options are  available? There are many treatment choices for menopause symptoms. The most common treatment is hormone replacement therapy. Many alternative therapies for menopause are emerging, including the use of herbal products. These supplements can be found in the form of herbs, teas, oils, tinctures, and pills. Common herbal supplements for menopause are made from plants that contain phytoestrogens. Phytoestrogens are compounds that occur naturally in plants and plant products. They act like estrogen in the body. Foods and herbs that contain phytoestrogens include:  Soy.  Flax seeds.  Red clover.  Ginseng.  What menopause symptoms may be helped if I use herbal products?  Vasomotor symptoms. These may be helped by: ? Soy. Some studies show that soy may have a moderate benefit for hot flashes. ? Black cohosh. There is limited evidence indicating this may be beneficial for hot flashes.  Symptoms that are related to heart and blood vessel disease. These may be helped by soy. Studies have shown that soy can help to lower cholesterol.  Depression. This may be helped by: ? St. John's wort. There is limited evidence that shows this may help mild to moderate depression. ? Black cohosh. There is evidence that this may help depression and mood swings.  Osteoporosis. Soy may help to decrease bone loss that is associated with menopause and may prevent osteoporosis. Limited evidence indicates that red clover may offer some bone loss protection as well. Other herbal products that are commonly used during menopause lack enough evidence to support their use as a replacement for conventional menopause therapies. These products include evening  primrose, ginseng, and red clover. What are the cases when herbal products should not be used during menopause? Do not use herbal products during menopause without your health care provider's approval if:  You are taking medicine.  You have a preexisting liver  condition.  Are there any risks in my taking herbal products during menopause? If you choose to use herbal products to help with symptoms of menopause, keep in mind that:  Different supplements have different and unmeasured amounts of herbal ingredients.  Herbal products are not regulated the same way that medicines are.  Concentrations of herbs may vary depending on the way they are prepared. For example, the concentration may be different in a pill, tea, oil, and tincture.  Little is known about the risks of using herbal products, particularly the risks of long-term use.  Some herbal supplements can be harmful when combined with certain medicines.  Most commonly reported side effects of herbal products are mild. However, if used improperly, many herbal supplements can cause serious problems. Talk to your health care provider before starting any herbal product. If problems develop, stop taking the supplement and let your health care provider know. This information is not intended to replace advice given to you by your health care provider. Make sure you discuss any questions you have with your health care provider. Document Released: 03/13/2008 Document Revised: 08/22/2016 Document Reviewed: 03/10/2014 Elsevier Interactive Patient Education  2017 [employer].

## 2018-05-02 ENCOUNTER — Encounter: Payer: Self-pay | Admitting: Obstetrics

## 2018-05-02 LAB — CERVICOVAGINAL ANCILLARY ONLY
Bacterial vaginitis: NEGATIVE
Candida vaginitis: NEGATIVE
TRICH (WINDOWPATH): NEGATIVE

## 2018-05-02 NOTE — Progress Notes (Signed)
Subjective:        Jenny Cook is a 57 y.o. female here for a routine exam.  Current complaints: None.    Personal health questionnaire:  Is patient Ashkenazi Jewish, have a family history of breast and/or ovarian cancer: no Is there a family history of uterine cancer diagnosed at age < 57, gastrointestinal cancer, urinary tract cancer, family member who is a Field seismologist syndrome-associated carrier: no Is the patient overweight and hypertensive, family history of diabetes, personal history of gestational diabetes, preeclampsia or PCOS: no Is patient over 24, have PCOS,  family history of premature CHD under age 54, diabetes, smoke, have hypertension or peripheral artery disease:  no At any time, has a partner hit, kicked or otherwise hurt or frightened you?: no Over the past 2 weeks, have you felt down, depressed or hopeless?: no Over the past 2 weeks, have you felt little interest or pleasure in doing things?:no   Gynecologic History Patient's last menstrual period was 08/23/2013. Contraception: post menopausal status Last Pap: 2016. Results were: normal Last mammogram: n/a. Results were: n/a  Obstetric History OB History  Gravida Para Term Preterm AB Living  2         2  SAB TAB Ectopic Multiple Live Births          2    # Outcome Date GA Lbr Len/2nd Weight Sex Delivery Anes PTL Lv  2 Gravida 07/27/98     Vag-Spont   LIV  1 Gravida 02/15/91     Vag-Spont   LIV    Past Medical History:  Diagnosis Date  . Diabetes mellitus   . Hypertension     Past Surgical History:  Procedure Laterality Date  . EYE SURGERY    . TUBAL LIGATION       Current Outpatient Medications:  .  amLODipine (NORVASC) 10 MG tablet, Take 10 mg by mouth every morning., Disp: , Rfl:  .  insulin NPH-regular Human (NOVOLIN 70/30) (70-30) 100 UNIT/ML injection, Novolin 70/30 U-100 Insulin  take 50u am/60u pm, Disp: , Rfl:  .  irbesartan-hydrochlorothiazide (AVALIDE) 150-12.5 MG tablet, , Disp: ,  Rfl:  .  latanoprost (XALATAN) 0.005 % ophthalmic solution, Place 1 drop into both eyes at bedtime., Disp: , Rfl:  .  LATANOPROST OP, latanoprost 0.005 % eye drops, Disp: , Rfl:  .  acetaminophen (TYLENOL) 325 MG tablet, Take 650 mg by mouth every 6 (six) hours as needed., Disp: , Rfl:  .  atorvastatin (LIPITOR) 20 MG tablet, , Disp: , Rfl:  .  NOVOLOG MIX 70/30 (70-30) 100 UNIT/ML injection, , Disp: , Rfl:   Current Facility-Administered Medications:  .  Bevacizumab (AVASTIN) SOLN 1.25 mg, 1.25 mg, Intravitreal, , Bernarda Caffey, MD, 1.25 mg at 02/18/18 0843 .  Bevacizumab (AVASTIN) SOLN 1.25 mg, 1.25 mg, Intravitreal, , Bernarda Caffey, MD, 1.25 mg at 03/20/18 2245 No Known Allergies  Social History   Tobacco Use  . Smoking status: Never Smoker  . Smokeless tobacco: Never Used  Substance Use Topics  . Alcohol use: No    Family History  Problem Relation Age of Onset  . Breast cancer Cousin 25  . Diabetes Cousin   . Diabetes Mother   . Diabetes Father   . Diabetes Sister   . Diabetes Brother   . Diabetes Maternal Aunt   . Diabetes Maternal Uncle   . Diabetes Paternal Aunt   . Diabetes Paternal Uncle       Review of Systems  Constitutional:  negative for fatigue and weight loss Respiratory: negative for cough and wheezing Cardiovascular: negative for chest pain, fatigue and palpitations Gastrointestinal: negative for abdominal pain and change in bowel habits Musculoskeletal:negative for myalgias Neurological: negative for gait problems and tremors Behavioral/Psych: negative for abusive relationship, depression Endocrine: negative for temperature intolerance    Genitourinary:negative for abnormal menstrual periods, genital lesions, hot flashes, sexual problems and vaginal discharge Integument/breast: negative for breast lump, breast tenderness, nipple discharge and skin lesion(s)    Objective:       BP 112/70   Pulse 83   Ht 5\' 6"  (1.676 m)   Wt 250 lb 6.4 oz (113.6  kg)   LMP 08/23/2013   BMI 40.42 kg/m  General:   alert  Skin:   no rash or abnormalities  Lungs:   clear to auscultation bilaterally  Heart:   regular rate and rhythm, S1, S2 normal, no murmur, click, rub or gallop  Breasts:   normal without suspicious masses, skin or nipple changes or axillary nodes  Abdomen:  normal findings: no organomegaly, soft, non-tender and no hernia  Pelvis:  External genitalia: normal general appearance Urinary system: urethral meatus normal and bladder without fullness, nontender Vaginal: normal without tenderness, induration or masses Cervix: normal appearance Adnexa: normal bimanual exam Uterus: anteverted and non-tender, normal size   Lab Review Urine pregnancy test Labs reviewed yes Radiologic studies reviewed no  50% of 20 min visit spent on counseling and coordination of care.   Assessment:    Healthy female exam.    Plan:    Education reviewed: calcium supplements, depression evaluation, low fat, low cholesterol diet, safe sex/STD prevention, self breast exams and weight bearing exercise. Follow up in: 1 year.   No orders of the defined types were placed in this encounter.  No orders of the defined types were placed in this encounter.   Shelly Bombard MD 05-01-2018

## 2018-05-03 LAB — CYTOLOGY - PAP
ADEQUACY: ABSENT
DIAGNOSIS: NEGATIVE
HPV (WINDOPATH): NOT DETECTED

## 2018-05-10 ENCOUNTER — Ambulatory Visit (HOSPITAL_BASED_OUTPATIENT_CLINIC_OR_DEPARTMENT_OTHER): Payer: BLUE CROSS/BLUE SHIELD | Attending: Internal Medicine | Admitting: Internal Medicine

## 2018-05-10 DIAGNOSIS — G4751 Confusional arousals: Secondary | ICD-10-CM | POA: Insufficient documentation

## 2018-05-10 DIAGNOSIS — R5383 Other fatigue: Secondary | ICD-10-CM | POA: Insufficient documentation

## 2018-05-10 DIAGNOSIS — G4700 Insomnia, unspecified: Secondary | ICD-10-CM | POA: Insufficient documentation

## 2018-05-10 DIAGNOSIS — G4733 Obstructive sleep apnea (adult) (pediatric): Secondary | ICD-10-CM

## 2018-05-10 DIAGNOSIS — G4710 Hypersomnia, unspecified: Secondary | ICD-10-CM | POA: Diagnosis not present

## 2018-05-10 DIAGNOSIS — E559 Vitamin D deficiency, unspecified: Secondary | ICD-10-CM | POA: Diagnosis not present

## 2018-05-16 NOTE — Progress Notes (Signed)
[facility] Note  05/17/2018     CHIEF COMPLAINT Patient presents for Retina Follow Up   HISTORY OF PRESENT ILLNESS: Jenny Cook is a 57 y.o. female who presents to the clinic today for:   HPI    Retina Follow Up    Patient presents with  Other.  In right eye.  Severity is moderate.  Since onset it is stable.  I, the attending physician,  performed the HPI with the patient and updated documentation appropriately.          Comments    Pt presents for vitreous heme OD f/u, pt states VA is doing better, OD still has cloudiness, but it seems better, pt denies FOL, pain, wavy vision or floaters, pt is using latanoprost gtts       Last edited by Bernarda Caffey, MD on 05/17/2018 11:06 AM. (History)    Pt reports she has not used latanoprost x 2 days;    Referring physician: Foye Spurling, MD No address on file  HISTORICAL INFORMATION:   Selected notes from the MEDICAL RECORD NUMBER Referred from Dr. Elliot Dally for concern of VH OU;   CURRENT MEDICATIONS: Current Outpatient Medications (Ophthalmic Drugs)  Medication Sig  . latanoprost (XALATAN) 0.005 % ophthalmic solution Place 1 drop into both eyes at bedtime.  . Latanoprost 0.005 % EMUL latanoprost 0.005 % eye drops  . LATANOPROST OP latanoprost 0.005 % eye drops   No current facility-administered medications for this visit.  (Ophthalmic Drugs)   Current Outpatient Medications (Other)  Medication Sig  . acetaminophen (TYLENOL) 325 MG tablet Take 650 mg by mouth every 6 (six) hours as needed.  Marland Kitchen amLODipine (NORVASC) 10 MG tablet Take 10 mg by mouth every morning.  Marland Kitchen atorvastatin (LIPITOR) 20 MG tablet   . hydrOXYzine (VISTARIL) 25 MG capsule hydroxyzine pamoate 25 mg capsule  . Influenza vac split quadrivalent PF (FLUARIX QUADRIVALENT) 0.5 ML injection Fluarix Quad 2018-2019 (PF) 60 mcg (15 mcg x 4)/0.5 mL IM syringe  . insulin NPH-regular Human (NOVOLIN 70/30) (70-30) 100 UNIT/ML injection  Novolin 70/30 U-100 Insulin  take 50u am/60u pm  . insulin NPH-regular Human (NOVOLIN 70/30) (70-30) 100 UNIT/ML injection Novolin 70/30 U-100 Insulin  take 50u am/60u pm  . irbesartan-hydrochlorothiazide (AVALIDE) 150-12.5 MG tablet   . ketoconazole (NIZORAL) 2 % cream ketoconazole 2 % topical cream  APPLY TO THE AFFECTED AREA(S) BY TOPICAL ROUTE ONCE DAILY - between toes  . NOVOLOG MIX 70/30 (70-30) 100 UNIT/ML injection   . POLYETHYLENE GLYCOL 3350-GRX PO polyethylene glycol 3350 17 gram oral powder packet   Current Facility-Administered Medications (Other)  Medication Route  . Bevacizumab (AVASTIN) SOLN 1.25 mg Intravitreal  . Bevacizumab (AVASTIN) SOLN 1.25 mg Intravitreal      REVIEW OF SYSTEMS: ROS    Positive for: Endocrine, Cardiovascular, Eyes   Negative for: Constitutional, Gastrointestinal, Neurological, Skin, Genitourinary, Musculoskeletal, HENT, Respiratory, Psychiatric, Allergic/Imm, Heme/Lymph   Last edited by Debbrah Alar, COT on 05/17/2018 10:26 AM. (History)       ALLERGIES No Known Allergies  PAST MEDICAL HISTORY Past Medical History:  Diagnosis Date  . Diabetes mellitus   . Hypertension    Past Surgical History:  Procedure Laterality Date  . EYE SURGERY    . TUBAL LIGATION      FAMILY HISTORY Family History  Problem Relation Age of Onset  . Breast cancer Cousin 25  . Diabetes Cousin   . Diabetes Mother   .  Diabetes Father   . Diabetes Sister   . Diabetes Brother   . Diabetes Maternal Aunt   . Diabetes Maternal Uncle   . Diabetes Paternal Aunt   . Diabetes Paternal Uncle     SOCIAL HISTORY Social History   Tobacco Use  . Smoking status: Never Smoker  . Smokeless tobacco: Never Used  Substance Use Topics  . Alcohol use: No  . Drug use: No         OPHTHALMIC EXAM:  Base Eye Exam    Visual Acuity (Snellen - Linear)      Right Left   Dist [place] 20/40 -1 20/80 +1   Dist ph [place] NI 20/40 -2       Tonometry (Tonopen, 10:32 AM)       Right Left   Pressure 37 24       Tonometry #2 (Tonopen, 10:32 AM)      Right Left   Pressure 34 22       Tonometry #3 (Tonopen, 10:36 AM)      Right Left   Pressure 26 19       Pupils      Dark Light Shape React APD   Right 4 2 Round Brisk None   Left 4 2 Round Brisk None       Visual Fields (Counting fingers)      Left Right    Full Full       Extraocular Movement      Right Left    Full, Ortho Full, Ortho       Neuro/Psych    Oriented x3:  Yes   Mood/Affect:  Normal       Dilation    Both eyes:  1.0% Mydriacyl, 2.5% Phenylephrine @ 10:37 AM        Slit Lamp and Fundus Exam    Slit Lamp Exam      Right Left   Lids/Lashes Dermatochalasis - upper lid Dermatochalasis - upper lid   Conjunctiva/Sclera Nasal and temporal Pinguecula, Melanosis Nasal and temporal Pinguecula, Melanosis   Cornea Arcus, 1+ Punctate epithelial erosions Arcus, Inferior 2+ Punctate epithelial erosions   Anterior Chamber Deep and quiet Deep and quiet   Iris Round and moderately dilated to 5.44mm, no NVI Round and moderately dilated to 17mm, no NVI   Lens 2+ Nuclear sclerosis, 3+ Cortical cataract 2+ Nuclear sclerosis, 2-3+ Cortical cataract   Vitreous +RBC in anterior vitreous -- improved, central VH -- clearing, Posterior vitreous detachment, old white blood settled inferiorly  Vitreous syneresis, old VH settled inferiorly       Fundus Exam      Right Left   Disc Pink and Sharp Nasal elveation, ?Early NVD, Pink and Sharp, nasal fibrosis   C/D Ratio 0.1 0.1   Macula Good foveal reflex, Retinal pigment epithelial mottling, +cystic changes; scattered MA, Epiretinal membrane Flat, Good foveal reflex, Retinal pigment epithelial mottling, mild Epiretinal membrane, scattered Microaneurysms and DBH, no exudate   Vessels mild attenuation Mild Vascular attenuation, mildly Tortuous, mild AV crossing changes, nasal NVE   Periphery Attached, PRP scars 360  Attached, VH and debris settled  inferiorly, 360 PRP scars, light PRP fill-in still room for fill-in, scattered DBH          IMAGING AND PROCEDURES  Imaging and Procedures for 02/11/18  OCT, Retina - OU - Both Eyes       Right Eye Quality was borderline. Central Foveal Thickness: 186. Progression has improved. Findings include normal foveal contour,  no SRF, intraretinal fluid (Persistent cystic changes temporally -- mild interval improvement ).   Left Eye Quality was good. Central Foveal Thickness: 200. Progression has been stable. Findings include normal foveal contour, no IRF, no SRF (Trace Cystic changes superiorly ).   Notes *Images captured and stored on drive  Diagnosis / Impression:  OD: NFP, No IRF/SRF -- tr cystic changes OS: NFP, No IRF/SRF  Clinical management:  See below  Abbreviations: NFP - Normal foveal profile. CME - cystoid macular edema. PED - pigment epithelial detachment. IRF - intraretinal fluid. SRF - subretinal fluid. EZ - ellipsoid zone. ERM - epiretinal membrane. ORA - outer retinal atrophy. ORT - outer retinal tubulation. SRHM - subretinal hyper-reflective material                  ASSESSMENT/PLAN:    ICD-10-CM   1. Vitreous hemorrhage of right eye (HCC) H43.11 OCT, Retina - OU - Both Eyes  2. 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 Vitreous hemorrhage, left eye (HCC) H43.12   4. Essential hypertension I10   5. Hypertensive retinopathy of both eyes H35.033   6. Retinal edema H35.81 OCT, Retina - OU - Both Eyes    1. Vitreous Hemorrhage OD - acute onset VH OD -- started ~02/07/18 - pt reports continued improvement since onset - likely secondary to #2 below (PDR) -- retina attached with PRP 360 - S/P IVA #1 OD (05.10.19), #2 (06.11.19) - on exam today, further improvement in [facility] compared to prior exam - VH precautions reviewed -- minimize activities, keep head elevated, avoid  ASA/NSAIDs/blood thinners as able - F/U 4 weeks - will plan for fill-in OD  2. Proliferative diabetic retinopathy OU - The incidence, risk factors for progression, natural history and treatment options for diabetic retinopathy were discussed with patient.   - The need for close monitoring of blood glucose, blood pressure, and serum lipids, avoiding cigarette or any type of tobacco, and the need for long term follow up was also discussed with patient. - s/p PRP OU - s/p PRP fill-in OS (07.12.19) - exam shows bilateral vitreous heme -- OD clearing - FA on 07.12.19 shows active NV OS with leakage - recommend PRP fill in OD today - pt unable to have any procedures done today due to no driver present  - f/u 4 wks, DFE, OCT, fill in PRP OD  3. Vitreous hemorrhage OS - improving and settling inferiorly  4,5. Hypertensive retinopathy OU - discussed importance of tight BP control and its role in current VH symptoms - monitor  6. Retinal edema - No retinal edema on exam or OCT    Ophthalmic Meds Ordered this visit:  No orders of the defined types were placed in this encounter.      Return in about 4 weeks (around 06/14/2018) for F/U PDR OU, DFE, OCT, FA.  There are no Patient Instructions on file for this visit.   Explained the diagnoses, plan, and follow up with the patient and they expressed understanding.  Patient expressed understanding of the importance of proper follow up care.   This document serves as a record of services personally performed by Gardiner Sleeper, MD, PhD. It was created on their behalf by Catha Brow, [place], a certified ophthalmic assistant. The creation of this record is the provider's dictation and/or activities during the visit.  Electronically signed by: Catha Brow, [place]  08.08.19 4:48 PM  Gardiner Sleeper, M.D., Ph.D. Diseases & Surgery of the Retina and Vitreous [facility]  I have reviewed the above documentation for  accuracy and completeness, and I agree with the above. Gardiner Sleeper, M.D., Ph.D. 05/21/18 4:48 PM    Abbreviations: M myopia (nearsighted); A astigmatism; H hyperopia (farsighted); P presbyopia; Mrx spectacle prescription;  CTL contact lenses; OD right eye; OS left eye; OU both eyes  XT exotropia; ET esotropia; PEK punctate epithelial keratitis; PEE punctate epithelial erosions; DES dry eye syndrome; MGD meibomian gland dysfunction; ATs artificial tears; PFAT's preservative free artificial tears; [place] nuclear sclerotic cataract; PSC posterior subcapsular cataract; ERM epi-retinal membrane; PVD posterior vitreous detachment; RD retinal detachment; DM diabetes mellitus; DR diabetic retinopathy; NPDR non-proliferative diabetic retinopathy; PDR proliferative diabetic retinopathy; CSME clinically significant macular edema; DME diabetic macular edema; dbh dot blot hemorrhages; CWS cotton wool spot; POAG primary open angle glaucoma; C/D cup-to-disc ratio; HVF humphrey visual field; GVF goldmann visual field; OCT optical coherence tomography; IOP intraocular pressure; BRVO Branch retinal vein occlusion; CRVO central retinal vein occlusion; CRAO central retinal artery occlusion; BRAO branch retinal artery occlusion; RT retinal tear; SB scleral buckle; PPV pars plana vitrectomy; VH Vitreous hemorrhage; PRP panretinal laser photocoagulation; IVK intravitreal kenalog; VMT vitreomacular traction; MH Macular hole;  NVD neovascularization of the disc; NVE neovascularization elsewhere; AREDS age related eye disease study; ARMD age related macular degeneration; POAG primary open angle glaucoma; EBMD epithelial/anterior basement membrane dystrophy; ACIOL anterior chamber intraocular lens; IOL intraocular lens; PCIOL posterior chamber intraocular lens; Phaco/IOL phacoemulsification with intraocular lens placement; [place] photorefractive keratectomy; LASIK laser assisted in situ keratomileusis; HTN hypertension; DM diabetes  mellitus; COPD chronic obstructive pulmonary disease

## 2018-05-17 ENCOUNTER — Ambulatory Visit (INDEPENDENT_AMBULATORY_CARE_PROVIDER_SITE_OTHER): Payer: BLUE CROSS/BLUE SHIELD | Admitting: Ophthalmology

## 2018-05-17 ENCOUNTER — Encounter (INDEPENDENT_AMBULATORY_CARE_PROVIDER_SITE_OTHER): Payer: Self-pay | Admitting: Ophthalmology

## 2018-05-17 DIAGNOSIS — H3581 Retinal edema: Secondary | ICD-10-CM

## 2018-05-17 DIAGNOSIS — I10 Essential (primary) hypertension: Secondary | ICD-10-CM | POA: Diagnosis not present

## 2018-05-17 DIAGNOSIS — H4312 Vitreous hemorrhage, left eye: Secondary | ICD-10-CM

## 2018-05-17 DIAGNOSIS — H35033 Hypertensive retinopathy, bilateral: Secondary | ICD-10-CM

## 2018-05-17 DIAGNOSIS — E113593 Type 2 diabetes mellitus with proliferative diabetic retinopathy without macular edema, bilateral: Secondary | ICD-10-CM | POA: Diagnosis not present

## 2018-05-17 DIAGNOSIS — H4311 Vitreous hemorrhage, right eye: Secondary | ICD-10-CM | POA: Diagnosis not present

## 2018-05-21 ENCOUNTER — Encounter (INDEPENDENT_AMBULATORY_CARE_PROVIDER_SITE_OTHER): Payer: Self-pay | Admitting: Ophthalmology

## 2018-05-21 DIAGNOSIS — G4733 Obstructive sleep apnea (adult) (pediatric): Secondary | ICD-10-CM | POA: Diagnosis not present

## 2018-05-21 NOTE — Procedures (Signed)
    Patient Name: Jenny Cook, [place] Date: 05/12/2018 Gender: Female D.O.B: 08/06/1961 Age (years): 78 Referring Provider: Latanya Presser MD Height (inches): 82 Interpreting Physician: Baird Lyons MD, ABSM Weight (lbs): 251 RPSGT: Jacolyn Reedy BMI: 41 MRN: [id number] Neck Size: 16.00  CLINICAL INFORMATION Sleep Study Type: HST Indication for sleep study: Excessive Daytime Sleepiness, Insomnia  Epworth Sleepiness Score: 3  SLEEP STUDY TECHNIQUE A multi-channel overnight portable sleep study was performed. The channels recorded were: nasal airflow, thoracic respiratory movement, and oxygen saturation with a pulse oximetry. Snoring was also monitored.  MEDICATIONS Patient self administered medications include: none reported.  SLEEP ARCHITECTURE Patient was studied for 345 minutes. The sleep efficiency was 93.8 % and the patient was supine for 50.9%. The arousal index was 0.0 per hour.  RESPIRATORY PARAMETERS The overall AHI was 27.3 per hour, with a central apnea index of 0.0 per hour.  The oxygen nadir was 78% during sleep.  CARDIAC DATA Mean heart rate during sleep was 72.8 bpm.  IMPRESSIONS - Moderate obstructive sleep apnea occurred during this study (AHI = 27.3/h). - No significant central sleep apnea occurred during this study (CAI = 0.0/h). - Oxygen desaturation was noted during this study (Min O2 = 78%). - Patient snored.  DIAGNOSIS - Obstructive Sleep Apnea (327.23 [G47.33 ICD-10])  RECOMMENDATIONS - Suggest CPAP titration sleep study or DME autopap. Other options would be based on clinical judgment. - Be careful with alcohol, sedatives and other CNS depressants that may worsen sleep apnea and disrupt normal sleep architecture. - Sleep hygiene should be reviewed to assess factors that may improve sleep quality. - Weight management and regular exercise should be initiated or continued.  [Electronically signed] 05/21/2018 02:42 PM  Baird Lyons  MD, [place], American Board of Sleep Medicine   NPI: [id number]                         [place] [place] [facility]  ELECTRONICALLY SIGNED ON:  05/21/2018, 2:41 PM [place] PH: [phone]   FX: [phone] [place]

## 2018-05-22 DIAGNOSIS — E782 Mixed hyperlipidemia: Secondary | ICD-10-CM | POA: Diagnosis not present

## 2018-05-22 DIAGNOSIS — E1142 Type 2 diabetes mellitus with diabetic polyneuropathy: Secondary | ICD-10-CM | POA: Diagnosis not present

## 2018-05-22 DIAGNOSIS — E1165 Type 2 diabetes mellitus with hyperglycemia: Secondary | ICD-10-CM | POA: Diagnosis not present

## 2018-05-22 DIAGNOSIS — I10 Essential (primary) hypertension: Secondary | ICD-10-CM | POA: Diagnosis not present

## 2018-06-12 DIAGNOSIS — G4733 Obstructive sleep apnea (adult) (pediatric): Secondary | ICD-10-CM | POA: Diagnosis not present

## 2018-06-17 ENCOUNTER — Encounter (INDEPENDENT_AMBULATORY_CARE_PROVIDER_SITE_OTHER): Payer: BLUE CROSS/BLUE SHIELD | Admitting: Ophthalmology

## 2018-06-20 NOTE — Progress Notes (Signed)
[facility] Note  06/21/2018     CHIEF COMPLAINT Patient presents for Retina Follow Up   HISTORY OF PRESENT ILLNESS: Jenny Cook is a 57 y.o. female who presents to the clinic today for:   HPI    Retina Follow Up    Patient presents with  Other.  In both eyes.  Severity is mild.  Since onset it is stable.  I, the attending physician,  performed the HPI with the patient and updated documentation appropriately.          Comments    F/U VIT. Hem OD. Patient states she was not able to make OV last week, her left eye vision has" increased blurriness". Pt is using Latanoprost  gtt's Qd. BS 122 this am, BS have been WINL this per patient. Pt is ready for [place] today if indicted.        Last edited by Bernarda Caffey, MD on 06/21/2018 11:27 AM. (History)       Referring physician: Foye Spurling, MD No address on file  HISTORICAL INFORMATION:   Selected notes from the MEDICAL RECORD NUMBER Referred from Dr. Elliot Dally for concern of VH OU;   CURRENT MEDICATIONS: Current Outpatient Medications (Ophthalmic Drugs)  Medication Sig  . latanoprost (XALATAN) 0.005 % ophthalmic solution Place 1 drop into both eyes at bedtime.  . Latanoprost 0.005 % EMUL latanoprost 0.005 % eye drops  . LATANOPROST OP latanoprost 0.005 % eye drops   No current facility-administered medications for this visit.  (Ophthalmic Drugs)   Current Outpatient Medications (Other)  Medication Sig  . acetaminophen (TYLENOL) 325 MG tablet Take 650 mg by mouth every 6 (six) hours as needed.  Marland Kitchen amLODipine (NORVASC) 10 MG tablet Take 10 mg by mouth every morning.  Marland Kitchen atorvastatin (LIPITOR) 20 MG tablet   . hydrOXYzine (VISTARIL) 25 MG capsule hydroxyzine pamoate 25 mg capsule  . Influenza vac split quadrivalent PF (FLUARIX QUADRIVALENT) 0.5 ML injection Fluarix Quad 2018-2019 (PF) 60 mcg (15 mcg x 4)/0.5 mL IM syringe  . insulin NPH-regular Human (NOVOLIN 70/30) (70-30) 100 UNIT/ML  injection Novolin 70/30 U-100 Insulin  take 50u am/60u pm  . insulin NPH-regular Human (NOVOLIN 70/30) (70-30) 100 UNIT/ML injection Novolin 70/30 U-100 Insulin  take 50u am/60u pm  . irbesartan-hydrochlorothiazide (AVALIDE) 150-12.5 MG tablet   . ketoconazole (NIZORAL) 2 % cream ketoconazole 2 % topical cream  APPLY TO THE AFFECTED AREA(S) BY TOPICAL ROUTE ONCE DAILY - between toes  . NOVOLOG MIX 70/30 (70-30) 100 UNIT/ML injection   . POLYETHYLENE GLYCOL 3350-GRX PO polyethylene glycol 3350 17 gram oral powder packet   Current Facility-Administered Medications (Other)  Medication Route  . Bevacizumab (AVASTIN) SOLN 1.25 mg Intravitreal  . Bevacizumab (AVASTIN) SOLN 1.25 mg Intravitreal  . Bevacizumab (AVASTIN) SOLN 1.25 mg Intravitreal      REVIEW OF SYSTEMS: ROS    Positive for: Endocrine, Eyes   Negative for: Constitutional, Gastrointestinal, Neurological, Skin, Genitourinary, Musculoskeletal, HENT, Cardiovascular, Respiratory, Psychiatric, Allergic/Imm, Heme/Lymph   Last edited by Zenovia Jordan, LPN on 0/98/1191 47:82 AM. (History)       ALLERGIES No Known Allergies  PAST MEDICAL HISTORY Past Medical History:  Diagnosis Date  . Diabetes mellitus   . Hypertension    Past Surgical History:  Procedure Laterality Date  . EYE SURGERY    . TUBAL LIGATION      FAMILY HISTORY Family History  Problem Relation Age of Onset  . Breast cancer Cousin 25  .  Diabetes Cousin   . Diabetes Mother   . Diabetes Father   . Diabetes Sister   . Diabetes Brother   . Diabetes Maternal Aunt   . Diabetes Maternal Uncle   . Diabetes Paternal Aunt   . Diabetes Paternal Uncle     SOCIAL HISTORY Social History   Tobacco Use  . Smoking status: Never Smoker  . Smokeless tobacco: Never Used  Substance Use Topics  . Alcohol use: No  . Drug use: No         OPHTHALMIC EXAM:  Base Eye Exam    Visual Acuity (Snellen - Linear)      Right Left   Dist [place] 20/50 20/150   Dist  ph [place] 20/30 NI       Tonometry (Tonopen, 10:15 AM)      Right Left   Pressure 17 20       Pupils      Dark Light Shape React APD   Right 4 3 Round Brisk None   Left 4 3 Round Brisk None       Visual Fields (Counting fingers)      Left Right     Full       Extraocular Movement      Right Left    Full, Ortho Full, Ortho       Neuro/Psych    Oriented x3:  Yes   Mood/Affect:  Normal       Dilation    Both eyes:  1.0% Mydriacyl, 2.5% Phenylephrine @ 10:15 AM        Slit Lamp and Fundus Exam    Slit Lamp Exam      Right Left   Lids/Lashes Dermatochalasis - upper lid Dermatochalasis - upper lid   Conjunctiva/Sclera Nasal and temporal Pinguecula, Melanosis Nasal and temporal Pinguecula, Melanosis   Cornea Arcus, 1+ Punctate epithelial erosions Arcus, Inferior 2+ Punctate epithelial erosions   Anterior Chamber Deep and quiet Deep and quiet   Iris Round and moderately dilated to 5.74mm, no NVI Round and moderately dilated to 12mm, no NVI   Lens 2+ Nuclear sclerosis, 3+ Cortical cataract 2+ Nuclear sclerosis, 2-3+ Cortical cataract   Vitreous central VH -- cleared, Posterior vitreous detachment Vitreous syneresis, +RBC in anterior viterous, mild diffuse VH       Fundus Exam      Right Left   Disc Pink and Sharp Hazy view, Nasal elveation, ?Early NVD, Pink and Sharp, nasal fibrosis   C/D Ratio 0.1 0.1   Macula Good foveal reflex, Retinal pigment epithelial mottling, +cystic changes; scattered MA, Epiretinal membrane Hazy view, Flat, Good foveal reflex, Retinal pigment epithelial mottling, mild Epiretinal membrane, scattered Microaneurysms and DBH, no exudate   Vessels mild attenuation, AV crossing changes, Copper wiring Mild Vascular attenuation, mildly Tortuous, mild AV crossing changes, nasal NVE   Periphery Attached, PRP scars 360  Hazy view; Attached, scattered DBH, blood clot / sub-retinal hemorrhage SN ? source          IMAGING AND PROCEDURES  Imaging and Procedures  for 02/11/18  OCT, Retina - OU - Both Eyes       Right Eye Quality was good. Central Foveal Thickness: 239. Progression has worsened. Findings include normal foveal contour, no SRF, intraretinal fluid (Interval worsening of IRF, trace ERM).   Left Eye Quality was poor. Central Foveal Thickness: 388. Progression has been stable. Findings include normal foveal contour, no SRF, intraretinal fluid (Vitreous opacities).   Notes *Images captured and stored on  drive  Diagnosis / Impression:  OD: NFP, no SRF; interval worsening of IRF OS: NFP, no SRF; mild IRF, interval development of vitreous opacities  Clinical management:  See below  Abbreviations: NFP - Normal foveal profile. CME - cystoid macular edema. PED - pigment epithelial detachment. IRF - intraretinal fluid. SRF - subretinal fluid. EZ - ellipsoid zone. ERM - epiretinal membrane. ORA - outer retinal atrophy. ORT - outer retinal tubulation. SRHM - subretinal hyper-reflective material         Intravitreal Injection, Pharmacologic Agent - OS - Left Eye       Time Out 06/21/2018. 11:58 AM. Confirmed correct patient, procedure, site, and patient consented.   Anesthesia Topical anesthesia was used. Anesthetic medications included Lidocaine 2%, Tetracaine 0.5%.   Procedure Preparation included 5% betadine to ocular surface, eyelid speculum. A supplied needle was used.   Injection:  1.25 mg Bevacizumab 1.25mg /0.27ml   NDC: 50242-060-01, Lot: [id number] , Expiration date: 07/31/2018   Route: Intravitreal, Site: Left Eye, Waste: 0 mL  Post-op Post injection exam found visual acuity of at least counting fingers. The patient tolerated the procedure well. There were no complications. The patient received written and verbal post procedure care education.                 ASSESSMENT/PLAN:    ICD-10-CM   1. Vitreous hemorrhage of right eye (HCC) H43.11 OCT, Retina - OU - Both Eyes  2. 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 Vitreous hemorrhage, left eye (HCC) H43.12 Intravitreal Injection, Pharmacologic Agent - OS - Left Eye    Bevacizumab (AVASTIN) SOLN 1.25 mg  4. Essential hypertension I10   5. Hypertensive retinopathy of both eyes H35.033   6. Retinal edema H35.81 OCT, Retina - OU - Both Eyes    1. Vitreous Hemorrhage OD - acute onset VH OD -- started ~02/07/18 - now resolved - likely secondary to #2 below (PDR) -- retina attached with PRP 360 - S/P IVA #1 OD (05.10.19), #2 (06.11.19) - F/U 10 days - will plan for fill-in PRP  2. Proliferative diabetic retinopathy OU - The incidence, risk factors for progression, natural history and treatment options for diabetic retinopathy were discussed with patient.   - The need for close monitoring of blood glucose, blood pressure, and serum lipids, avoiding cigarette or any type of tobacco, and the need for long term follow up was also discussed with patient. - s/p PRP OU - s/p PRP fill-in OS (07.12.19) - history of bilateral vitreous heme -- OD now clear; OS worse today - FA on 07.12.19 shows active NV OS with leakage - f/u 10 days, DFE, OCT, fill in PRP  3. Vitreous hemorrhage OS - new onset diffuse hemorrhage -- started 9.11.19 - VA decreased to 20/150 today - recommend IVA #1 OS today (09.13.19) - pt wishes to proceed - RBA of procedure discussed, questions answered - informed consent obtained and signed - see procedure note - VH precautions reviewed -- minimize activities, keep head elevated, avoid ASA/NSAIDs/blood thinners as able - F/U 10 days -- needs laser PRP fill in both eyes  4,5. Hypertensive retinopathy OU - discussed importance of tight BP control and its role in current VH symptoms - monitor  6. Retinal edema - No retinal edema on exam or  OCT    Ophthalmic Meds Ordered  this visit:  Meds ordered this encounter  Medications  . Bevacizumab (AVASTIN) SOLN 1.25 mg       Return in about 10 days (around 07/01/2018) for F/U VH OS, DFE, OCT.  There are no Patient Instructions on file for this visit.   Explained the diagnoses, plan, and follow up with the patient and they expressed understanding.  Patient expressed understanding of the importance of proper follow up care.   This document serves as a record of services personally performed by Gardiner Sleeper, MD, PhD. It was created on their behalf by Ernest Mallick, OA, an ophthalmic assistant. The creation of this record is the provider's dictation and/or activities during the visit.    Electronically signed by: Ernest Mallick, OA  09.12.2019 12:16 PM    Gardiner Sleeper, M.D., Ph.D. Diseases & Surgery of the Retina and Vitreous [facility]  I have reviewed the above documentation for accuracy and completeness, and I agree with the above. Gardiner Sleeper, M.D., Ph.D. 06/21/18 12:19 PM    Abbreviations: M myopia (nearsighted); A astigmatism; H hyperopia (farsighted); P presbyopia; Mrx spectacle prescription;  CTL contact lenses; OD right eye; OS left eye; OU both eyes  XT exotropia; ET esotropia; PEK punctate epithelial keratitis; PEE punctate epithelial erosions; DES dry eye syndrome; MGD meibomian gland dysfunction; ATs artificial tears; PFAT's preservative free artificial tears; [place] nuclear sclerotic cataract; PSC posterior subcapsular cataract; ERM epi-retinal membrane; PVD posterior vitreous detachment; RD retinal detachment; DM diabetes mellitus; DR diabetic retinopathy; NPDR non-proliferative diabetic retinopathy; PDR proliferative diabetic retinopathy; CSME clinically significant macular edema; DME diabetic macular edema; dbh dot blot hemorrhages; CWS cotton wool spot; POAG primary open angle glaucoma; C/D cup-to-disc ratio; HVF humphrey visual field;  GVF goldmann visual field; OCT optical coherence tomography; IOP intraocular pressure; BRVO Branch retinal vein occlusion; CRVO central retinal vein occlusion; CRAO central retinal artery occlusion; BRAO branch retinal artery occlusion; RT retinal tear; SB scleral buckle; PPV pars plana vitrectomy; VH Vitreous hemorrhage; PRP panretinal laser photocoagulation; IVK intravitreal kenalog; VMT vitreomacular traction; MH Macular hole;  NVD neovascularization of the disc; NVE neovascularization elsewhere; AREDS age related eye disease study; ARMD age related macular degeneration; POAG primary open angle glaucoma; EBMD epithelial/anterior basement membrane dystrophy; ACIOL anterior chamber intraocular lens; IOL intraocular lens; PCIOL posterior chamber intraocular lens; Phaco/IOL phacoemulsification with intraocular lens placement; [place] photorefractive keratectomy; LASIK laser assisted in situ keratomileusis; HTN hypertension; DM diabetes mellitus; COPD chronic obstructive pulmonary disease

## 2018-06-21 ENCOUNTER — Encounter (INDEPENDENT_AMBULATORY_CARE_PROVIDER_SITE_OTHER): Payer: Self-pay | Admitting: Ophthalmology

## 2018-06-21 ENCOUNTER — Ambulatory Visit (INDEPENDENT_AMBULATORY_CARE_PROVIDER_SITE_OTHER): Payer: BLUE CROSS/BLUE SHIELD | Admitting: Ophthalmology

## 2018-06-21 DIAGNOSIS — H4311 Vitreous hemorrhage, right eye: Secondary | ICD-10-CM | POA: Diagnosis not present

## 2018-06-21 DIAGNOSIS — H4312 Vitreous hemorrhage, left eye: Secondary | ICD-10-CM | POA: Diagnosis not present

## 2018-06-21 DIAGNOSIS — I10 Essential (primary) hypertension: Secondary | ICD-10-CM

## 2018-06-21 DIAGNOSIS — H35033 Hypertensive retinopathy, bilateral: Secondary | ICD-10-CM

## 2018-06-21 DIAGNOSIS — H3581 Retinal edema: Secondary | ICD-10-CM | POA: Diagnosis not present

## 2018-06-21 DIAGNOSIS — E113593 Type 2 diabetes mellitus with proliferative diabetic retinopathy without macular edema, bilateral: Secondary | ICD-10-CM

## 2018-06-21 MED ORDER — BEVACIZUMAB CHEMO INJECTION 1.25MG/0.05ML SYRINGE FOR KALEIDOSCOPE
1.2500 mg | INTRAVITREAL | Status: AC
  Administered 2018-06-21: 1.25 mg via INTRAVITREAL

## 2018-06-28 NOTE — Progress Notes (Signed)
[facility] Note  07/01/2018     CHIEF COMPLAINT Patient presents for Retina Follow Up   HISTORY OF PRESENT ILLNESS: Jenny Cook is a 57 y.o. female who presents to the clinic today for:   HPI    Retina Follow Up    Patient presents with  Other.  In right eye.  This started 2 months ago.  Severity is mild.  Since onset it is stable.  I, the attending physician,  performed the HPI with the patient and updated documentation appropriately.          Comments    F/U Vit. Hem od. S/P Avastin OS 06/21/18.Patient states she can "see a little more " OS. Bs 224,denies hypo/hyper glycemic episodes. Pt is ready for tx today if indicted.        Last edited by Bernarda Caffey, MD on 07/01/2018 11:37 AM. (History)       Referring physician: Foye Spurling, MD No address on file  HISTORICAL INFORMATION:   Selected notes from the MEDICAL RECORD NUMBER Referred from Dr. Elliot Dally for concern of VH OU;   CURRENT MEDICATIONS: Current Outpatient Medications (Ophthalmic Drugs)  Medication Sig  . latanoprost (XALATAN) 0.005 % ophthalmic solution Place 1 drop into both eyes at bedtime.  . Latanoprost 0.005 % EMUL latanoprost 0.005 % eye drops  . LATANOPROST OP latanoprost 0.005 % eye drops  . prednisoLONE acetate (PRED FORTE) 1 % ophthalmic suspension Place 1 drop into the right eye 4 (four) times daily for 7 days.   No current facility-administered medications for this visit.  (Ophthalmic Drugs)   Current Outpatient Medications (Other)  Medication Sig  . acetaminophen (TYLENOL) 325 MG tablet Take 650 mg by mouth every 6 (six) hours as needed.  Marland Kitchen amLODipine (NORVASC) 10 MG tablet Take 10 mg by mouth every morning.  Marland Kitchen atorvastatin (LIPITOR) 20 MG tablet   . hydrOXYzine (VISTARIL) 25 MG capsule hydroxyzine pamoate 25 mg capsule  . Influenza vac split quadrivalent PF (FLUARIX QUADRIVALENT) 0.5 ML injection Fluarix Quad 2018-2019 (PF) 60 mcg (15 mcg x 4)/0.5 mL  IM syringe  . insulin NPH-regular Human (NOVOLIN 70/30) (70-30) 100 UNIT/ML injection Novolin 70/30 U-100 Insulin  take 50u am/60u pm  . insulin NPH-regular Human (NOVOLIN 70/30) (70-30) 100 UNIT/ML injection Novolin 70/30 U-100 Insulin  take 50u am/60u pm  . irbesartan-hydrochlorothiazide (AVALIDE) 150-12.5 MG tablet   . ketoconazole (NIZORAL) 2 % cream ketoconazole 2 % topical cream  APPLY TO THE AFFECTED AREA(S) BY TOPICAL ROUTE ONCE DAILY - between toes  . NOVOLOG MIX 70/30 (70-30) 100 UNIT/ML injection   . POLYETHYLENE GLYCOL 3350-GRX PO polyethylene glycol 3350 17 gram oral powder packet   Current Facility-Administered Medications (Other)  Medication Route  . Bevacizumab (AVASTIN) SOLN 1.25 mg Intravitreal  . Bevacizumab (AVASTIN) SOLN 1.25 mg Intravitreal  . Bevacizumab (AVASTIN) SOLN 1.25 mg Intravitreal      REVIEW OF SYSTEMS: ROS    Positive for: Endocrine, Eyes   Negative for: Constitutional, Gastrointestinal, Neurological, Skin, Genitourinary, Musculoskeletal, HENT, Cardiovascular, Respiratory, Psychiatric, Allergic/Imm, Heme/Lymph   Last edited by Zenovia Jordan, LPN on 4/33/2951 88:41 AM. (History)       ALLERGIES No Known Allergies  PAST MEDICAL HISTORY Past Medical History:  Diagnosis Date  . Diabetes mellitus   . Hypertension    Past Surgical History:  Procedure Laterality Date  . EYE SURGERY    . TUBAL LIGATION      FAMILY HISTORY Family History  Problem Relation Age of Onset  . Breast cancer Cousin 25  . Diabetes Cousin   . Diabetes Mother   . Diabetes Father   . Diabetes Sister   . Diabetes Brother   . Diabetes Maternal Aunt   . Diabetes Maternal Uncle   . Diabetes Paternal Aunt   . Diabetes Paternal Uncle     SOCIAL HISTORY Social History   Tobacco Use  . Smoking status: Never Smoker  . Smokeless tobacco: Never Used  Substance Use Topics  . Alcohol use: No  . Drug use: No         OPHTHALMIC EXAM:  Base Eye Exam     Visual Acuity (Snellen - Linear)      Right Left   Dist [place] 20/50 +2 20/100 -2   Dist ph [place] 20/50 +3 20/100       Tonometry (Tonopen, 10:23 AM)      Right Left   Pressure 17 17       Pupils      Dark Light Shape React APD   Right 4 3 Round Brisk None   Left 4 3 Round Brisk None       Visual Fields (Counting fingers)      Left Right    Full Full       Extraocular Movement      Right Left    Full, Ortho Full, Ortho       Neuro/Psych    Oriented x3:  Yes   Mood/Affect:  Normal       Dilation    Both eyes:  1.0% Mydriacyl, 2.5% Phenylephrine @ 10:20 AM       Dilation #2    Both eyes:  1.0% Mydriacyl, 2.5% Phenylephrine @ 11:45 AM        Slit Lamp and Fundus Exam    Slit Lamp Exam      Right Left   Lids/Lashes Dermatochalasis - upper lid Dermatochalasis - upper lid   Conjunctiva/Sclera Nasal and temporal Pinguecula, Melanosis Nasal and temporal Pinguecula, Melanosis   Cornea Arcus, 1+ Punctate epithelial erosions Arcus, Inferior 2+ Punctate epithelial erosions   Anterior Chamber Deep and quiet Deep and quiet   Iris Round and moderately dilated to 5.67mm, no NVI Round and moderately dilated to 38mm, no NVI   Lens 2+ Nuclear sclerosis, 3+ Cortical cataract 2+ Nuclear sclerosis, 2-3+ Cortical cataract   Vitreous central VH -- settling inferiorly, turning white, Posterior vitreous detachment Vitreous syneresis, +RBC in anterior viterous, mild diffuse VH -- clearing and settling inferiorly        Fundus Exam      Right Left   Disc Pink and Sharp Hazy view, Nasal elveation, ?Early NVD, Pink and Sharp, nasal fibrosis   C/D Ratio 0.1 0.1   Macula Good foveal reflex, Retinal pigment epithelial mottling, +cystic changes; scattered MA, Epiretinal membrane Hazy view, Flat, Good foveal reflex, Retinal pigment epithelial mottling, mild Epiretinal membrane, scattered Microaneurysms and DBH, no exudate   Vessels mild attenuation, AV crossing changes, Copper wiring Mild Vascular  attenuation, mildly Tortuous, mild AV crossing changes, nasal NVE   Periphery Attached, PRP scars 360 -- room for fill in Hazy view; Attached, scattered DBH, blood clot / sub-retinal hemorrhage SN ? Source, pre-retinal hem at 0300 periphery          IMAGING AND PROCEDURES  Imaging and Procedures for 02/11/18  OCT, Retina - OU - Both Eyes       Right Eye Quality was good. Central  Foveal Thickness: 243. Progression has improved. Findings include normal foveal contour, no SRF, intraretinal fluid (Interval improvement in IRF, trace ERM).   Left Eye Quality was good. Central Foveal Thickness: 199. Progression has been stable. Findings include normal foveal contour, no SRF, intraretinal fluid (Vitreous opacities).   Notes *Images captured and stored on drive  Diagnosis / Impression:  OD: NFP, no SRF; interval improvement in IRF OS: NFP, no SRF; mild IRF, interval improvement in vitreous opacities  Clinical management:  See below  Abbreviations: NFP - Normal foveal profile. CME - cystoid macular edema. PED - pigment epithelial detachment. IRF - intraretinal fluid. SRF - subretinal fluid. EZ - ellipsoid zone. ERM - epiretinal membrane. ORA - outer retinal atrophy. ORT - outer retinal tubulation. SRHM - subretinal hyper-reflective material         Panretinal Photocoagulation - OD - Right Eye       LASER PROCEDURE NOTE  Diagnosis:   Proliferative Diabetic Retinopathy, RIGHT EYE  Procedure:  Pan-retinal photocoagulation using slit lamp laser, RIGHT EYE, fill-in  Anesthesia:  Topical  Surgeon: Bernarda Caffey, MD, PhD   Informed consent obtained, operative eye marked, and time out performed prior to initiation of laser.   Lumenis [id number] slit lamp laser Pattern: 3x3 square Power: 300 mW Duration: 30 msec  Spot size: 200 microns  # spots: 1421 spots 360 peripheral and fill-in  Complications: None.  Notes: significant vitreous heme and cortical cataract obscuring view  and preventing laser up take in scattered focal peripheral areas  RTC: 3 wks  Patient tolerated the procedure well and received written and verbal post-procedure care information/education.                  ASSESSMENT/PLAN:    ICD-10-CM   1. Vitreous hemorrhage of right eye (HCC) H43.11   2. 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 Vitreous hemorrhage, left eye (HCC) H43.12   4. Essential hypertension I10   5. Hypertensive retinopathy of both eyes H35.033   6. Retinal edema H35.81 OCT, Retina - OU - Both Eyes    1. Vitreous Hemorrhage OD - acute onset VH OD -- started ~02/07/18 - now resolved - likely secondary to #2 below (PDR) -- retina attached with PRP 360 - S/P IVA #1 OD (05.10.19), #2 (06.11.19) - F/U 3 weeks  2. Proliferative diabetic retinopathy OU - The incidence, risk factors for progression, natural history and treatment options for diabetic retinopathy were discussed with patient.   - The need for close monitoring of blood glucose, blood pressure, and serum lipids, avoiding cigarette or any type of tobacco, and the need for long term follow up was also discussed with patient. - s/p PRP OU - s/p PRP fill-in OS (07.12.19) - recommend PRP fill-in OD today (09.23.19) - pt wishes to proceed - RBA of procedure discussed, questions answered - informed consent obtained and signed - see procedure note - history of bilateral vitreous heme -- OD now clear; OS clearing - FA on 07.12.19 shows active NV OS with leakage - f/u 3 weeks, DFE, OCT  3. Vitreous hemorrhage OS - new onset diffuse hemorrhage -- started 9.11.19 - today, slightly improved -- VA 20/100 from 20/150 - s/p IVA OS #1 (09.13.19) - VH precautions reviewed -- minimize activities, keep head elevated, avoid ASA/NSAIDs/blood thinners as able - F/U 3 weeks -- will try to do  fill in PRP  OS if VH clears more  4,5. Hypertensive retinopathy OU - discussed importance of tight BP control and its role in current VH symptoms - monitor  6. Retinal edema - No retinal edema on exam or OCT    Ophthalmic Meds Ordered this visit:  Meds ordered this encounter  Medications  . prednisoLONE acetate (PRED FORTE) 1 % ophthalmic suspension    Sig: Place 1 drop into the right eye 4 (four) times daily for 7 days.    Dispense:  10 mL    Refill:  0       Return in about 3 weeks (around 07/22/2018) for F/U PDR OU, VH OU, DFE, OCT.  There are no Patient Instructions on file for this visit.   Explained the diagnoses, plan, and follow up with the patient and they expressed understanding.  Patient expressed understanding of the importance of proper follow up care.   This document serves as a record of services personally performed by Gardiner Sleeper, MD, PhD. It was created on their behalf by Ernest Mallick, OA, an ophthalmic assistant. The creation of this record is the provider's dictation and/or activities during the visit.    Electronically signed by: Ernest Mallick, OA  09.20.19 1:20 PM   This document serves as a record of services personally performed by Gardiner Sleeper, MD, PhD. It was created on their behalf by Catha Brow, [place], a certified ophthalmic assistant. The creation of this record is the provider's dictation and/or activities during the visit.  Electronically signed by: Catha Brow, COA  09.23.19 1:20 PM   Gardiner Sleeper, M.D., Ph.D. Diseases & Surgery of the Retina and Vitreous [facility]   I have reviewed the above documentation for accuracy and completeness, and I agree with the above. Gardiner Sleeper, M.D., Ph.D. 07/03/18 1:20 PM    Abbreviations: M myopia (nearsighted); A astigmatism; H hyperopia (farsighted); P presbyopia; Mrx spectacle prescription;  CTL contact lenses; OD right eye; OS left eye; OU both eyes  XT  exotropia; ET esotropia; PEK punctate epithelial keratitis; PEE punctate epithelial erosions; DES dry eye syndrome; MGD meibomian gland dysfunction; ATs artificial tears; PFAT's preservative free artificial tears; [place] nuclear sclerotic cataract; PSC posterior subcapsular cataract; ERM epi-retinal membrane; PVD posterior vitreous detachment; RD retinal detachment; DM diabetes mellitus; DR diabetic retinopathy; NPDR non-proliferative diabetic retinopathy; PDR proliferative diabetic retinopathy; CSME clinically significant macular edema; DME diabetic macular edema; dbh dot blot hemorrhages; CWS cotton wool spot; POAG primary open angle glaucoma; C/D cup-to-disc ratio; HVF humphrey visual field; GVF goldmann visual field; OCT optical coherence tomography; IOP intraocular pressure; BRVO Branch retinal vein occlusion; CRVO central retinal vein occlusion; CRAO central retinal artery occlusion; BRAO branch retinal artery occlusion; RT retinal tear; SB scleral buckle; PPV pars plana vitrectomy; VH Vitreous hemorrhage; PRP panretinal laser photocoagulation; IVK intravitreal kenalog; VMT vitreomacular traction; MH Macular hole;  NVD neovascularization of the disc; NVE neovascularization elsewhere; AREDS age related eye disease study; ARMD age related macular degeneration; POAG primary open angle glaucoma; EBMD epithelial/anterior basement membrane dystrophy; ACIOL anterior chamber intraocular lens; IOL intraocular lens; PCIOL posterior chamber intraocular lens; Phaco/IOL phacoemulsification with intraocular lens placement; [place] photorefractive keratectomy; LASIK laser assisted in situ keratomileusis; HTN hypertension; DM diabetes mellitus; COPD chronic obstructive pulmonary disease

## 2018-07-01 ENCOUNTER — Ambulatory Visit (INDEPENDENT_AMBULATORY_CARE_PROVIDER_SITE_OTHER): Payer: BLUE CROSS/BLUE SHIELD | Admitting: Ophthalmology

## 2018-07-01 ENCOUNTER — Encounter (INDEPENDENT_AMBULATORY_CARE_PROVIDER_SITE_OTHER): Payer: Self-pay | Admitting: Ophthalmology

## 2018-07-01 DIAGNOSIS — E113593 Type 2 diabetes mellitus with proliferative diabetic retinopathy without macular edema, bilateral: Secondary | ICD-10-CM | POA: Diagnosis not present

## 2018-07-01 DIAGNOSIS — H3581 Retinal edema: Secondary | ICD-10-CM | POA: Diagnosis not present

## 2018-07-01 DIAGNOSIS — I10 Essential (primary) hypertension: Secondary | ICD-10-CM | POA: Diagnosis not present

## 2018-07-01 DIAGNOSIS — H4311 Vitreous hemorrhage, right eye: Secondary | ICD-10-CM | POA: Diagnosis not present

## 2018-07-01 DIAGNOSIS — H35033 Hypertensive retinopathy, bilateral: Secondary | ICD-10-CM

## 2018-07-01 DIAGNOSIS — H4312 Vitreous hemorrhage, left eye: Secondary | ICD-10-CM

## 2018-07-01 MED ORDER — PREDNISOLONE ACETATE 1 % OP SUSP: 1.0000 [drp] | Freq: Four times a day (QID) | OPHTHALMIC | 0 refills | Status: AC

## 2018-07-03 ENCOUNTER — Encounter (INDEPENDENT_AMBULATORY_CARE_PROVIDER_SITE_OTHER): Payer: Self-pay | Admitting: Ophthalmology

## 2018-07-09 ENCOUNTER — Encounter (INDEPENDENT_AMBULATORY_CARE_PROVIDER_SITE_OTHER): Payer: BLUE CROSS/BLUE SHIELD | Admitting: Ophthalmology

## 2018-07-12 DIAGNOSIS — G4733 Obstructive sleep apnea (adult) (pediatric): Secondary | ICD-10-CM | POA: Diagnosis not present

## 2018-07-17 NOTE — Progress Notes (Addendum)
[facility] Note  07/22/2018     CHIEF COMPLAINT Patient presents for Retina Follow Up   HISTORY OF PRESENT ILLNESS: Jenny Cook is a 57 y.o. female who presents to the clinic today for:   HPI    Retina Follow Up    Patient presents with  Other.  In right eye.  Severity is moderate.  Duration of 3 weeks.  Since onset it is stable.  I, the attending physician,  performed the HPI with the patient and updated documentation appropriately.          Comments    Pt presents for vitreous hemorrhage OD f/u, pt states she received an injxn OS a tlast visit and that seemed to clear her vision up some, pt states it's still a little blurry but not as bad as before, pt denies flashes, floaters, pain or wavy vision, pts blood sugar this morning was 166       Last edited by Bernarda Caffey, MD on 07/22/2018 10:23 AM. (History)       Referring physician: Audley Hose, MD [place], [place] [address]  HISTORICAL INFORMATION:   Selected notes from the MEDICAL RECORD NUMBER Referred from Dr. Elliot Dally for concern of VH OU;   CURRENT MEDICATIONS: Current Outpatient Medications (Ophthalmic Drugs)  Medication Sig  . latanoprost (XALATAN) 0.005 % ophthalmic solution Place 1 drop into both eyes at bedtime.  . Latanoprost 0.005 % EMUL latanoprost 0.005 % eye drops  . LATANOPROST OP latanoprost 0.005 % eye drops   No current facility-administered medications for this visit.  (Ophthalmic Drugs)   Current Outpatient Medications (Other)  Medication Sig  . acetaminophen (TYLENOL) 325 MG tablet Take 650 mg by mouth every 6 (six) hours as needed.  Marland Kitchen amLODipine (NORVASC) 10 MG tablet Take 10 mg by mouth every morning.  Marland Kitchen atorvastatin (LIPITOR) 20 MG tablet   . hydrOXYzine (VISTARIL) 25 MG capsule hydroxyzine pamoate 25 mg capsule  . Influenza vac split quadrivalent PF (FLUARIX QUADRIVALENT) 0.5 ML injection Fluarix Quad 2018-2019 (PF) 60  mcg (15 mcg x 4)/0.5 mL IM syringe  . insulin NPH-regular Human (NOVOLIN 70/30) (70-30) 100 UNIT/ML injection Novolin 70/30 U-100 Insulin  take 50u am/60u pm  . insulin NPH-regular Human (NOVOLIN 70/30) (70-30) 100 UNIT/ML injection Novolin 70/30 U-100 Insulin  take 50u am/60u pm  . irbesartan-hydrochlorothiazide (AVALIDE) 150-12.5 MG tablet   . ketoconazole (NIZORAL) 2 % cream ketoconazole 2 % topical cream  APPLY TO THE AFFECTED AREA(S) BY TOPICAL ROUTE ONCE DAILY - between toes  . NOVOLOG MIX 70/30 (70-30) 100 UNIT/ML injection   . POLYETHYLENE GLYCOL 3350-GRX PO polyethylene glycol 3350 17 gram oral powder packet   Current Facility-Administered Medications (Other)  Medication Route  . Bevacizumab (AVASTIN) SOLN 1.25 mg Intravitreal  . Bevacizumab (AVASTIN) SOLN 1.25 mg Intravitreal  . Bevacizumab (AVASTIN) SOLN 1.25 mg Intravitreal  . Bevacizumab (AVASTIN) SOLN 1.25 mg Intravitreal  . Bevacizumab (AVASTIN) SOLN 1.25 mg Intravitreal      REVIEW OF SYSTEMS: ROS    Positive for: Endocrine, Cardiovascular, Eyes   Negative for: Constitutional, Gastrointestinal, Neurological, Skin, Genitourinary, Musculoskeletal, HENT, Respiratory, Psychiatric, Allergic/Imm, Heme/Lymph   Last edited by Debbrah Alar, COT on 07/22/2018  9:34 AM. (History)       ALLERGIES No Known Allergies  PAST MEDICAL HISTORY Past Medical History:  Diagnosis Date  . Diabetes mellitus   . Hypertension    Past Surgical History:  Procedure  Laterality Date  . EYE SURGERY    . TUBAL LIGATION      FAMILY HISTORY Family History  Problem Relation Age of Onset  . Breast cancer Cousin 25  . Diabetes Cousin   . Diabetes Mother   . Diabetes Father   . Diabetes Sister   . Diabetes Brother   . Diabetes Maternal Aunt   . Diabetes Maternal Uncle   . Diabetes Paternal Aunt   . Diabetes Paternal Uncle     SOCIAL HISTORY Social History   Tobacco Use  . Smoking status: Never Smoker  . Smokeless tobacco:  Never Used  Substance Use Topics  . Alcohol use: No  . Drug use: No         OPHTHALMIC EXAM:  Base Eye Exam    Visual Acuity (Snellen - Linear)      Right Left   Dist [place] 20/60 -1 20/50 +2   Dist ph [place] 20/60 +2 20/40 +1       Tonometry (Tonopen, 9:39 AM)      Right Left   Pressure 33 30       Tonometry #2 (Tonopen, 9:39 AM)      Right Left   Pressure 31 28       Tonometry Comments   squeezing       Pupils      Dark Light Shape React APD   Right 4 2 Round Brisk None   Left 4 2 Round Brisk None       Visual Fields (Counting fingers)      Left Right    Full Full       Extraocular Movement      Right Left    Full, Ortho Full, Ortho       Neuro/Psych    Oriented x3:  Yes   Mood/Affect:  Normal       Dilation    Both eyes:  1.0% Mydriacyl, 2.5% Phenylephrine @ 9:44 AM        Slit Lamp and Fundus Exam    Slit Lamp Exam      Right Left   Lids/Lashes Dermatochalasis - upper lid Dermatochalasis - upper lid   Conjunctiva/Sclera Nasal and temporal Pinguecula, Melanosis Nasal and temporal Pinguecula, Melanosis   Cornea Arcus, 1+ Punctate epithelial erosions Arcus, Inferior 2+ Punctate epithelial erosions   Anterior Chamber Deep and quiet Deep and quiet   Iris Round and moderately dilated to 5.26mm, no NVI Round and moderately dilated to 89mm, no NVI   Lens 2+ Nuclear sclerosis, 3+ Cortical cataract 2+ Nuclear sclerosis, 2-3+ Cortical cataract   Vitreous central VH -- cleared, Posterior vitreous detachment Vitreous syneresis, +RBC in anterior viterous, mild diffuse VH -- clearing and settling inferiorly, still red blood inferiorly       Fundus Exam      Right Left   Disc Pink and Sharp, No NVD +NVD w/ nasal elveation/fibrosis   C/D Ratio 0.1 0.1   Macula Blunted foveal reflex, Retinal pigment epithelial mottling, + central cystic changes; scattered MA, Epiretinal membrane Flat, Blunted foveal reflex, Retinal pigment epithelial mottling, mild Epiretinal  membrane, scattered Microaneurysms and DBH, no exudate   Vessels mild attenuation, AV crossing changes, Copper wiring Mild Vascular attenuation, mildly Tortuous, mild AV crossing changes, nasal NVE   Periphery Attached, PRP scars 360 -- room for fill in Attached, scattered DBH, blood clot / sub-retinal hemorrhage SN ?Source, pre-retinal hem at 0300 periphery, PRP scars -- room for fill in  IMAGING AND PROCEDURES  Imaging and Procedures for 02/11/18  OCT, Retina - OU - Both Eyes       Right Eye Quality was good. Central Foveal Thickness: 314. Progression has worsened. Findings include normal foveal contour, no SRF, intraretinal fluid, epiretinal membrane (Interval increase in IRF).   Left Eye Quality was good. Central Foveal Thickness: 241. Progression has been stable. Findings include normal foveal contour, no SRF, intraretinal fluid (Interval increase in IRF, interval decrease in Vitreous opacities).   Notes *Images captured and stored on drive  Diagnosis / Impression:  OD: NFP, no SRF; interval increase in IRF OS: NFP, no SRF; mild increase in IRF, interval improvement in vitreous opacities  Clinical management:  See below  Abbreviations: NFP - Normal foveal profile. CME - cystoid macular edema. PED - pigment epithelial detachment. IRF - intraretinal fluid. SRF - subretinal fluid. EZ - ellipsoid zone. ERM - epiretinal membrane. ORA - outer retinal atrophy. ORT - outer retinal tubulation. SRHM - subretinal hyper-reflective material         Intravitreal Injection, Pharmacologic Agent - OS - Left Eye       Time Out 07/22/2018. 12:00 AM. Confirmed correct patient, procedure, site, and patient consented.   Anesthesia Topical anesthesia was used. Anesthetic medications included Lidocaine 2%, Proparacaine 0.5%.   Procedure Preparation included 5% betadine to ocular surface, eyelid speculum. A 30 gauge needle was used.   Injection:  1.25 mg Bevacizumab  1.25mg /0.80ml   NDC: 84166-063-01, Lot: [id number] , Expiration date: 09/19/2018   Route: Intravitreal, Site: Left Eye, Waste: 0 mg  Post-op Post injection exam found visual acuity of at least counting fingers. The patient tolerated the procedure well. There were no complications. The patient received written and verbal post procedure care education.        Intravitreal Injection, Pharmacologic Agent - OD - Right Eye       Time Out 07/22/2018. 11:24 AM. Confirmed correct patient, procedure, site, and patient consented.   Anesthesia Topical anesthesia was used. Anesthetic medications included Lidocaine 2%, Proparacaine 0.5%.   Procedure Preparation included 5% betadine to ocular surface, eyelid speculum. A supplied needle was used.   Injection:  1.25 mg Bevacizumab 1.25mg /0.35ml   NDC: 50242-060-01, Lot: [id number] , Expiration date: 09/19/2018   Route: Intravitreal, Site: Right Eye, Waste: 0 mg  Post-op Post injection exam found visual acuity of at least counting fingers. The patient tolerated the procedure well. There were no complications. The patient received written and verbal post procedure care education.                 ASSESSMENT/PLAN:    ICD-10-CM   1. Vitreous hemorrhage of right eye (HCC) H43.11 Intravitreal Injection, Pharmacologic Agent - OD - Right Eye    Bevacizumab (AVASTIN) SOLN 1.25 mg  2. Proliferative diabetic retinopathy of both eyes with macular edema associated with type 2 diabetes mellitus (HCC) [id number] Intravitreal Injection, Pharmacologic Agent - OS - Left Eye    Intravitreal Injection, Pharmacologic Agent - OD - Right Eye    Bevacizumab (AVASTIN) SOLN 1.25 mg    Bevacizumab (AVASTIN) SOLN 1.25 mg  3. Vitreous hemorrhage, left eye (HCC) H43.12 Intravitreal Injection, Pharmacologic Agent - OS - Left Eye    Bevacizumab (AVASTIN) SOLN 1.25 mg  4. Essential hypertension I10   5. Hypertensive retinopathy of both eyes H35.033   6. Retinal  edema H35.81 OCT, Retina - OU - Both Eyes    1. Vitreous Hemorrhage OD - acute onset VH OD -- started ~02/07/18 -  now resolved - likely secondary to #2 below (PDR) -- retina attached with PRP 360 - S/P IVA #1 OD (05.10.19), #2 (06.11.19) - recommend IVA OD #3 (10.14.19) - pt wishes to proceed - RBA of procedure discussed, questions answered - informed consent obtained and signed - see procedure note - F/U 2 weeks  2. Proliferative diabetic retinopathy OU - The incidence, risk factors for progression, natural history and treatment options for diabetic retinopathy were discussed with patient.   - The need for close monitoring of blood glucose, blood pressure, and serum lipids, avoiding cigarette or any type of tobacco, and the need for long term follow up was also discussed with patient. - s/p PRP OU - s/p PRP fill-in OS (07.12.19) - S/P PRP fill-in OD (09.23.19) - history of bilateral vitreous heme -- OD now clear; OS clearing - FA on 07.12.19 shows active NV OS with leakage - OCT 10.14.19 shows +DME OU -- recommend IVA OU - see #1 and #3 - f/u 2 weeks, DFE, OCT, possible laser OS  3. Vitreous hemorrhage OS - new onset diffuse hemorrhage -- started 9.11.19 - today, slightly improved -- BCVA 20/40 from 20/150 - s/p IVA OS #1 (09.13.19) - recommend IVA OS #2 (10.14.19) - pt wishes to proceed - RBA of procedure discussed, questions answered - informed consent obtained and signed - see procedure note - VH precautions reviewed -- minimize activities, keep head elevated, avoid ASA/NSAIDs/blood thinners as able - F/U 2 weeks -- will try to do fill in PRP OS if VH clears more  4,5. Hypertensive retinopathy OU - discussed importance of tight BP control and its role in current VH symptoms - monitor  6. Retinal edema - No retinal edema on exam or OCT    Ophthalmic Meds Ordered this visit:  Meds ordered this encounter  Medications  . Bevacizumab (AVASTIN) SOLN 1.25 mg  .  Bevacizumab (AVASTIN) SOLN 1.25 mg       Return in about 2 weeks (around 08/05/2018) for F/U PDR OU, DFE, OCT.  There are no Patient Instructions on file for this visit.   Explained the diagnoses, plan, and follow up with the patient and they expressed understanding.  Patient expressed understanding of the importance of proper follow up care.   This document serves as a record of services personally performed by Gardiner Sleeper, MD, PhD. It was created on their behalf by Catha Brow, [place], a certified ophthalmic assistant. The creation of this record is the provider's dictation and/or activities during the visit.  Electronically signed by: Catha Brow, COA  10.09.19 1:30 PM   Gardiner Sleeper, M.D., Ph.D. Diseases & Surgery of the Retina and Vitreous [facility]   I have reviewed the above documentation for accuracy and completeness, and I agree with the above. Gardiner Sleeper, M.D., Ph.D. 07/23/18 1:30 PM    Abbreviations: M myopia (nearsighted); A astigmatism; H hyperopia (farsighted); P presbyopia; Mrx spectacle prescription;  CTL contact lenses; OD right eye; OS left eye; OU both eyes  XT exotropia; ET esotropia; PEK punctate epithelial keratitis; PEE punctate epithelial erosions; DES dry eye syndrome; MGD meibomian gland dysfunction; ATs artificial tears; PFAT's preservative free artificial tears; [place] nuclear sclerotic cataract; PSC posterior subcapsular cataract; ERM epi-retinal membrane; PVD posterior vitreous detachment; RD retinal detachment; DM diabetes mellitus; DR diabetic retinopathy; NPDR non-proliferative diabetic retinopathy; PDR proliferative diabetic retinopathy; CSME clinically significant macular edema; DME diabetic macular edema; dbh dot blot hemorrhages; CWS cotton wool spot; POAG  primary open angle glaucoma; C/D cup-to-disc ratio; HVF humphrey visual field; GVF goldmann visual field; OCT optical coherence tomography; IOP intraocular  pressure; BRVO Branch retinal vein occlusion; CRVO central retinal vein occlusion; CRAO central retinal artery occlusion; BRAO branch retinal artery occlusion; RT retinal tear; SB scleral buckle; PPV pars plana vitrectomy; VH Vitreous hemorrhage; PRP panretinal laser photocoagulation; IVK intravitreal kenalog; VMT vitreomacular traction; MH Macular hole;  NVD neovascularization of the disc; NVE neovascularization elsewhere; AREDS age related eye disease study; ARMD age related macular degeneration; POAG primary open angle glaucoma; EBMD epithelial/anterior basement membrane dystrophy; ACIOL anterior chamber intraocular lens; IOL intraocular lens; PCIOL posterior chamber intraocular lens; Phaco/IOL phacoemulsification with intraocular lens placement; [place] photorefractive keratectomy; LASIK laser assisted in situ keratomileusis; HTN hypertension; DM diabetes mellitus; COPD chronic obstructive pulmonary disease

## 2018-07-22 ENCOUNTER — Encounter (INDEPENDENT_AMBULATORY_CARE_PROVIDER_SITE_OTHER): Payer: Self-pay | Admitting: Ophthalmology

## 2018-07-22 ENCOUNTER — Ambulatory Visit (INDEPENDENT_AMBULATORY_CARE_PROVIDER_SITE_OTHER): Payer: BLUE CROSS/BLUE SHIELD | Admitting: Ophthalmology

## 2018-07-22 DIAGNOSIS — H4312 Vitreous hemorrhage, left eye: Secondary | ICD-10-CM | POA: Diagnosis not present

## 2018-07-22 DIAGNOSIS — H4311 Vitreous hemorrhage, right eye: Secondary | ICD-10-CM

## 2018-07-22 DIAGNOSIS — H3581 Retinal edema: Secondary | ICD-10-CM

## 2018-07-22 DIAGNOSIS — I10 Essential (primary) hypertension: Secondary | ICD-10-CM | POA: Diagnosis not present

## 2018-07-22 DIAGNOSIS — E113513 Type 2 diabetes mellitus with proliferative diabetic retinopathy with macular edema, bilateral: Secondary | ICD-10-CM

## 2018-07-22 DIAGNOSIS — H35033 Hypertensive retinopathy, bilateral: Secondary | ICD-10-CM

## 2018-07-22 MED ORDER — BEVACIZUMAB CHEMO INJECTION 1.25MG/0.05ML SYRINGE FOR KALEIDOSCOPE
1.2500 mg | INTRAVITREAL | Status: AC
  Administered 2018-07-22: 1.25 mg via INTRAVITREAL

## 2018-08-02 NOTE — Progress Notes (Signed)
[facility] Note  08/05/2018     CHIEF COMPLAINT Patient presents for Retina Follow Up   HISTORY OF PRESENT ILLNESS: Jenny Cook is a 57 y.o. female who presents to the clinic today for:   HPI    Retina Follow Up    Patient presents with  Other.  In right eye.  Severity is moderate.  Duration of 2 weeks.  I, the attending physician,  performed the HPI with the patient and updated documentation appropriately.          Comments    Patient states vision improved OU. BS was 70 this am. Last A1c level unknown. Patient denies floaters, FOL.        Last edited by Bernarda Caffey, MD on 08/05/2018 11:34 AM. (History)       Referring physician: Audley Hose, MD [place], [place] [address]  HISTORICAL INFORMATION:   Selected notes from the MEDICAL RECORD NUMBER Referred from Dr. Elliot Dally for concern of VH OU;   CURRENT MEDICATIONS: Current Outpatient Medications (Ophthalmic Drugs)  Medication Sig  . latanoprost (XALATAN) 0.005 % ophthalmic solution Place 1 drop into both eyes at bedtime.  . Latanoprost 0.005 % EMUL latanoprost 0.005 % eye drops  . LATANOPROST OP latanoprost 0.005 % eye drops   No current facility-administered medications for this visit.  (Ophthalmic Drugs)   Current Outpatient Medications (Other)  Medication Sig  . acetaminophen (TYLENOL) 325 MG tablet Take 650 mg by mouth every 6 (six) hours as needed.  Marland Kitchen amLODipine (NORVASC) 10 MG tablet Take 10 mg by mouth every morning.  Marland Kitchen atorvastatin (LIPITOR) 20 MG tablet   . hydrOXYzine (VISTARIL) 25 MG capsule hydroxyzine pamoate 25 mg capsule  . Influenza vac split quadrivalent PF (FLUARIX QUADRIVALENT) 0.5 ML injection Fluarix Quad 2018-2019 (PF) 60 mcg (15 mcg x 4)/0.5 mL IM syringe  . insulin NPH-regular Human (NOVOLIN 70/30) (70-30) 100 UNIT/ML injection Novolin 70/30 U-100 Insulin  take 50u am/60u pm  . insulin NPH-regular Human (NOVOLIN 70/30)  (70-30) 100 UNIT/ML injection Novolin 70/30 U-100 Insulin  take 50u am/60u pm  . irbesartan-hydrochlorothiazide (AVALIDE) 150-12.5 MG tablet   . irbesartan-hydrochlorothiazide (AVALIDE) 300-12.5 MG tablet Take by mouth as directed.  Marland Kitchen ketoconazole (NIZORAL) 2 % cream ketoconazole 2 % topical cream  APPLY TO THE AFFECTED AREA(S) BY TOPICAL ROUTE ONCE DAILY - between toes  . NOVOLOG MIX 70/30 (70-30) 100 UNIT/ML injection   . POLYETHYLENE GLYCOL 3350-GRX PO polyethylene glycol 3350 17 gram oral powder packet   Current Facility-Administered Medications (Other)  Medication Route  . Bevacizumab (AVASTIN) SOLN 1.25 mg Intravitreal  . Bevacizumab (AVASTIN) SOLN 1.25 mg Intravitreal  . Bevacizumab (AVASTIN) SOLN 1.25 mg Intravitreal  . Bevacizumab (AVASTIN) SOLN 1.25 mg Intravitreal  . Bevacizumab (AVASTIN) SOLN 1.25 mg Intravitreal      REVIEW OF SYSTEMS: ROS    Positive for: Endocrine, Cardiovascular, Eyes   Negative for: Constitutional, Gastrointestinal, Neurological, Skin, Genitourinary, Musculoskeletal, HENT, Respiratory, Psychiatric, Allergic/Imm, Heme/Lymph   Last edited by Roselee Nova D on 08/05/2018  9:59 AM. (History)       ALLERGIES No Known Allergies  PAST MEDICAL HISTORY Past Medical History:  Diagnosis Date  . Diabetes mellitus   . Hypertension    Past Surgical History:  Procedure Laterality Date  . EYE SURGERY    . TUBAL LIGATION      FAMILY HISTORY Family History  Problem Relation Age of Onset  . Breast  cancer Cousin 69  . Diabetes Cousin   . Diabetes Mother   . Diabetes Father   . Diabetes Sister   . Diabetes Brother   . Diabetes Maternal Aunt   . Diabetes Maternal Uncle   . Diabetes Paternal Aunt   . Diabetes Paternal Uncle     SOCIAL HISTORY Social History   Tobacco Use  . Smoking status: Never Smoker  . Smokeless tobacco: Never Used  Substance Use Topics  . Alcohol use: No  . Drug use: No         OPHTHALMIC EXAM:  Base Eye Exam     Visual Acuity (Snellen - Linear)      Right Left   Dist Chloride 20/60 +2 20/40 -2   Dist ph [place] NI NI       Tonometry (Tonopen, 10:11 AM)      Right Left   Pressure 22 23       Pupils      Dark Light Shape React APD   Right 4 2 Round Brisk None   Left 4 2 Round Brisk None       Visual Fields (Counting fingers)      Left Right    Full Full       Extraocular Movement      Right Left    Full, Ortho Full, Ortho       Neuro/Psych    Oriented x3:  Yes   Mood/Affect:  Normal       Dilation    Both eyes:  1.0% Mydriacyl, 2.5% Phenylephrine @ 10:11 AM        Slit Lamp and Fundus Exam    Slit Lamp Exam      Right Left   Lids/Lashes Dermatochalasis - upper lid Dermatochalasis - upper lid   Conjunctiva/Sclera Nasal and temporal Pinguecula, Melanosis Nasal and temporal Pinguecula, Melanosis   Cornea Arcus, 1+ Punctate epithelial erosions Arcus, Inferior 2+ Punctate epithelial erosions   Anterior Chamber Deep and quiet Deep and quiet   Iris Round and moderately dilated to 5.66mm, no NVI Round and moderately dilated to 69mm, no NVI   Lens 2+ Nuclear sclerosis, 3+ Cortical cataract 2+ Nuclear sclerosis, 2-3+ Cortical cataract   Vitreous central VH  cleared, Posterior vitreous detachment Vitreous syneresis, +RBC in anterior viterous, mild diffuse VH -- clearing and settling inferiorly, still red blood inferiorly, Posterior vitreous detachment       Fundus Exam      Right Left   Disc Pink and Sharp, No NVD NVD w/ nasal elveation/fibrosis regressing   C/D Ratio 0.1 0.1   Macula Blunted foveal reflex, Retinal pigment epithelial mottling, + central cystic changes; scattered MA, Epiretinal membrane Flat, Blunted foveal reflex, Retinal pigment epithelial mottling, mild Epiretinal membrane, scattered Microaneurysms and DBH, no exudate   Vessels mild attenuation, AV crossing changes, Copper wiring Mild Vascular attenuation, mildly Tortuous, mild AV crossing changes, nasal NVE regressing    Periphery Attached, PRP scars 360 -- room for fill in Attached, scattered DBH, 360 PRP scars -- room for fill in          IMAGING AND PROCEDURES  Imaging and Procedures for 02/11/18  OCT, Retina - OU - Both Eyes       Right Eye Quality was good. Central Foveal Thickness: 315. Progression has worsened. Findings include normal foveal contour, no SRF, intraretinal fluid, epiretinal membrane (Interval increase in IRF/cysts).   Left Eye Quality was good. Central Foveal Thickness: 268. Findings include normal foveal contour,  no SRF, intraretinal fluid (Interval increase in IRF, interval decrease in Vitreous opacities).   Notes *Images captured and stored on drive  Diagnosis / Impression:  OD: NFP, no SRF; interval increase in IRF OS: NFP, no SRF; mild increase in IRF, interval improvement in vitreous opacities  Clinical management:  See below  Abbreviations: NFP - Normal foveal profile. CME - cystoid macular edema. PED - pigment epithelial detachment. IRF - intraretinal fluid. SRF - subretinal fluid. EZ - ellipsoid zone. ERM - epiretinal membrane. ORA - outer retinal atrophy. ORT - outer retinal tubulation. SRHM - subretinal hyper-reflective material         Panretinal Photocoagulation - OS - Left Eye       LASER PROCEDURE NOTE  Diagnosis:   Proliferative Diabetic Retinopathy, LEFT EYE  Procedure:  Pan-retinal photocoagulation using slit lamp laser, LEFT EYE, fill-in  Anesthesia:  Topical  Surgeon: Bernarda Caffey, MD, PhD   Informed consent obtained, operative eye marked, and time out performed prior to initiation of laser.   Lumenis [id number] slit lamp laser Pattern: 3x3 square Power: 340 mW Duration: 30 msec  Spot size: 200 microns  # spots: 1124 spots fill-in superior hemisphere   Complications: None.  Notes: significant vitreous heme and cortical cataract obscuring view and preventing laser up take inferiorly and scattered focal areas  RTC: 2-3  wks  Patient tolerated the procedure well and received written and verbal post-procedure care information/education.                  ASSESSMENT/PLAN:    ICD-10-CM   1. Vitreous hemorrhage of right eye (HCC) H43.11 CANCELED: Intravitreal Injection, Pharmacologic Agent - OD - Right Eye  2. Proliferative diabetic retinopathy of both eyes with macular edema associated with type 2 diabetes mellitus (HCC) E11.3513 OCT, Retina - OU - Both Eyes    Panretinal Photocoagulation - OS - Left Eye  3. Vitreous hemorrhage, left eye (HCC) H43.12 Panretinal Photocoagulation - OS - Left Eye  4. Essential hypertension I10   5. Hypertensive retinopathy of both eyes H35.033   6. Retinal edema H35.81   7. Proliferative diabetic retinopathy of both eyes without macular edema associated with type 2 diabetes mellitus ([place]) E26.8341     1. Vitreous Hemorrhage OD - acute onset VH OD -- started ~02/07/18 - now resolved - likely secondary to #2 below (PDR) -- retina attached with PRP 360 - S/P IVA #1 OD (05.10.19), #2 (06.11.19), #3 (10.14.19) - s/p PRP fill in OS (10.28.19) - pt wishes to proceed - RBA of procedure discussed, questions answered - informed consent obtained and signed - see procedure note - F/U 2 weeks, possible injection OU  2. Proliferative diabetic retinopathy OU - The incidence, risk factors for progression, natural history and treatment options for diabetic retinopathy were discussed with patient.   - The need for close monitoring of blood glucose, blood pressure, and serum lipids, avoiding cigarette or any type of tobacco, and the need for long term follow up was also discussed with patient. - s/p PRP OU - s/p PRP fill-in OS (07.12.19) - S/P PRP fill-in OD (09.23.19) - history of bilateral vitreous heme -- OD now clear; OS clearing - FA on 07.12.19 shows active NV OS with leakage - OCT 10.14.19 shows +DME OU  - see #1 and #3 - recommend fill in PRP OS today, 10.28.19 - f/u 2  weeks, DFE, OCT, possible injection OU  3. Vitreous hemorrhage OS - new onset diffuse hemorrhage -- started 9.11.19 -  today, slightly improved -- BCVA 20/40 from 20/150 - s/p IVA OS #1 (09.13.19), #2 (10.14.19) - VH clearing further today - recommend fill-in PRP laser OS today 10.28.19 - pt wishes to proceed - RBA of procedure discussed, questions answered - informed consent obtained and signed - see procedure note - VH precautions reviewed -- minimize activities, keep head elevated, avoid ASA/NSAIDs/blood thinners as able - F/U 2 weeks  4,5. Hypertensive retinopathy OU - discussed importance of tight BP control and its role in current VH symptoms - monitor  6. Retinal edema - No retinal edema on exam or OCT    Ophthalmic Meds Ordered this visit:  No orders of the defined types were placed in this encounter.      Return in about 2 weeks (around 08/19/2018) for F/U VH, DFE, OCT, possible injections.  There are no Patient Instructions on file for this visit.   Explained the diagnoses, plan, and follow up with the patient and they expressed understanding.  Patient expressed understanding of the importance of proper follow up care.   This document serves as a record of services personally performed by Gardiner Sleeper, MD, PhD. It was created on their behalf by Catha Brow, [place], a certified ophthalmic assistant. The creation of this record is the provider's dictation and/or activities during the visit.  Electronically signed by: Catha Brow, [place]  10.09.19 12:41 PM   Gardiner Sleeper, M.D., Ph.D. Diseases & Surgery of the Retina and Vitreous [facility]   I have reviewed the above documentation for accuracy and completeness, and I agree with the above. Gardiner Sleeper, M.D., Ph.D. 07/23/18 12:41 PM    Abbreviations: M myopia (nearsighted); A astigmatism; H hyperopia (farsighted); P presbyopia; Mrx spectacle prescription;  CTL contact lenses;  OD right eye; OS left eye; OU both eyes  XT exotropia; ET esotropia; PEK punctate epithelial keratitis; PEE punctate epithelial erosions; DES dry eye syndrome; MGD meibomian gland dysfunction; ATs artificial tears; PFAT's preservative free artificial tears; [place] nuclear sclerotic cataract; PSC posterior subcapsular cataract; ERM epi-retinal membrane; PVD posterior vitreous detachment; RD retinal detachment; DM diabetes mellitus; DR diabetic retinopathy; NPDR non-proliferative diabetic retinopathy; PDR proliferative diabetic retinopathy; CSME clinically significant macular edema; DME diabetic macular edema; dbh dot blot hemorrhages; CWS cotton wool spot; POAG primary open angle glaucoma; C/D cup-to-disc ratio; HVF humphrey visual field; GVF goldmann visual field; OCT optical coherence tomography; IOP intraocular pressure; BRVO Branch retinal vein occlusion; CRVO central retinal vein occlusion; CRAO central retinal artery occlusion; BRAO branch retinal artery occlusion; RT retinal tear; SB scleral buckle; PPV pars plana vitrectomy; VH Vitreous hemorrhage; PRP panretinal laser photocoagulation; IVK intravitreal kenalog; VMT vitreomacular traction; MH Macular hole;  NVD neovascularization of the disc; NVE neovascularization elsewhere; AREDS age related eye disease study; ARMD age related macular degeneration; POAG primary open angle glaucoma; EBMD epithelial/anterior basement membrane dystrophy; ACIOL anterior chamber intraocular lens; IOL intraocular lens; PCIOL posterior chamber intraocular lens; Phaco/IOL phacoemulsification with intraocular lens placement; [place] photorefractive keratectomy; LASIK laser assisted in situ keratomileusis; HTN hypertension; DM diabetes mellitus; COPD chronic obstructive pulmonary disease

## 2018-08-05 ENCOUNTER — Ambulatory Visit (INDEPENDENT_AMBULATORY_CARE_PROVIDER_SITE_OTHER): Payer: BLUE CROSS/BLUE SHIELD | Admitting: Ophthalmology

## 2018-08-05 ENCOUNTER — Encounter (INDEPENDENT_AMBULATORY_CARE_PROVIDER_SITE_OTHER): Payer: Self-pay | Admitting: Ophthalmology

## 2018-08-05 DIAGNOSIS — E113513 Type 2 diabetes mellitus with proliferative diabetic retinopathy with macular edema, bilateral: Secondary | ICD-10-CM | POA: Diagnosis not present

## 2018-08-05 DIAGNOSIS — I10 Essential (primary) hypertension: Secondary | ICD-10-CM

## 2018-08-05 DIAGNOSIS — H35033 Hypertensive retinopathy, bilateral: Secondary | ICD-10-CM

## 2018-08-05 DIAGNOSIS — H3581 Retinal edema: Secondary | ICD-10-CM

## 2018-08-05 DIAGNOSIS — H4312 Vitreous hemorrhage, left eye: Secondary | ICD-10-CM

## 2018-08-05 DIAGNOSIS — E113593 Type 2 diabetes mellitus with proliferative diabetic retinopathy without macular edema, bilateral: Secondary | ICD-10-CM

## 2018-08-05 DIAGNOSIS — H4311 Vitreous hemorrhage, right eye: Secondary | ICD-10-CM

## 2018-08-12 DIAGNOSIS — G4733 Obstructive sleep apnea (adult) (pediatric): Secondary | ICD-10-CM | POA: Diagnosis not present

## 2018-08-19 ENCOUNTER — Encounter (INDEPENDENT_AMBULATORY_CARE_PROVIDER_SITE_OTHER): Payer: BLUE CROSS/BLUE SHIELD | Admitting: Ophthalmology

## 2018-08-21 DIAGNOSIS — I10 Essential (primary) hypertension: Secondary | ICD-10-CM | POA: Diagnosis not present

## 2018-08-21 DIAGNOSIS — E1142 Type 2 diabetes mellitus with diabetic polyneuropathy: Secondary | ICD-10-CM | POA: Diagnosis not present

## 2018-08-21 DIAGNOSIS — E1165 Type 2 diabetes mellitus with hyperglycemia: Secondary | ICD-10-CM | POA: Diagnosis not present

## 2018-08-21 DIAGNOSIS — E782 Mixed hyperlipidemia: Secondary | ICD-10-CM | POA: Diagnosis not present

## 2018-08-23 ENCOUNTER — Other Ambulatory Visit: Payer: Self-pay | Admitting: Internal Medicine

## 2018-08-23 DIAGNOSIS — Z1231 Encounter for screening mammogram for malignant neoplasm of breast: Secondary | ICD-10-CM

## 2018-08-30 NOTE — Progress Notes (Signed)
[facility] Note  09/02/2018     CHIEF COMPLAINT Patient presents for Retina Follow Up   HISTORY OF PRESENT ILLNESS: Jenny Cook is a 57 y.o. female who presents to the clinic today for:   HPI    Retina Follow Up    Patient presents with  Other.  In right eye.  This started 4 months ago.  Severity is mild.  Since onset it is gradually improving.  I, the attending physician,  performed the HPI with the patient and updated documentation appropriately.          Comments    F/U VH OU. Patient states her vision has "gotten better", "I don't want any injections this time".Denies new visual onsets/issues. BS 101 this am, BS WINL per patient.        Last edited by Bernarda Caffey, MD on 09/02/2018  3:32 PM. (History)    pt states VH seems to be clearing a little bit   Referring physician: Audley Hose, MD [place], [place] [id number]  HISTORICAL INFORMATION:   Selected notes from the MEDICAL RECORD NUMBER Referred from Dr. Elliot Dally for concern of VH OU;   CURRENT MEDICATIONS: Current Outpatient Medications (Ophthalmic Drugs)  Medication Sig  . latanoprost (XALATAN) 0.005 % ophthalmic solution Place 1 drop into both eyes at bedtime.  . Latanoprost 0.005 % EMUL latanoprost 0.005 % eye drops  . LATANOPROST OP latanoprost 0.005 % eye drops   No current facility-administered medications for this visit.  (Ophthalmic Drugs)   Current Outpatient Medications (Other)  Medication Sig  . acetaminophen (TYLENOL) 325 MG tablet Take 650 mg by mouth every 6 (six) hours as needed.  Marland Kitchen amLODipine (NORVASC) 10 MG tablet Take 10 mg by mouth every morning.  Marland Kitchen atorvastatin (LIPITOR) 20 MG tablet   . hydrOXYzine (VISTARIL) 25 MG capsule hydroxyzine pamoate 25 mg capsule  . Influenza vac split quadrivalent PF (FLUARIX QUADRIVALENT) 0.5 ML injection Fluarix Quad 2018-2019 (PF) 60 mcg (15 mcg x 4)/0.5 mL IM syringe  . insulin  NPH-regular Human (NOVOLIN 70/30) (70-30) 100 UNIT/ML injection Novolin 70/30 U-100 Insulin  take 50u am/60u pm  . insulin NPH-regular Human (NOVOLIN 70/30) (70-30) 100 UNIT/ML injection Novolin 70/30 U-100 Insulin  take 50u am/60u pm  . irbesartan-hydrochlorothiazide (AVALIDE) 150-12.5 MG tablet   . irbesartan-hydrochlorothiazide (AVALIDE) 300-12.5 MG tablet Take by mouth as directed.  Marland Kitchen ketoconazole (NIZORAL) 2 % cream ketoconazole 2 % topical cream  APPLY TO THE AFFECTED AREA(S) BY TOPICAL ROUTE ONCE DAILY - between toes  . NOVOLOG MIX 70/30 (70-30) 100 UNIT/ML injection   . POLYETHYLENE GLYCOL 3350-GRX PO polyethylene glycol 3350 17 gram oral powder packet   Current Facility-Administered Medications (Other)  Medication Route  . Bevacizumab (AVASTIN) SOLN 1.25 mg Intravitreal  . Bevacizumab (AVASTIN) SOLN 1.25 mg Intravitreal  . Bevacizumab (AVASTIN) SOLN 1.25 mg Intravitreal  . Bevacizumab (AVASTIN) SOLN 1.25 mg Intravitreal  . Bevacizumab (AVASTIN) SOLN 1.25 mg Intravitreal      REVIEW OF SYSTEMS: ROS    Positive for: Endocrine, Eyes   Negative for: Constitutional, Gastrointestinal, Neurological, Skin, Genitourinary, Musculoskeletal, HENT, Cardiovascular, Respiratory, Psychiatric, Allergic/Imm, Heme/Lymph   Last edited by Zenovia Jordan, LPN on 72/06/4708  6:28 PM. (History)       ALLERGIES No Known Allergies  PAST MEDICAL HISTORY Past Medical History:  Diagnosis Date  . Diabetes mellitus   . Hypertension    Past Surgical History:  Procedure Laterality Date  .  EYE SURGERY    . TUBAL LIGATION      FAMILY HISTORY Family History  Problem Relation Age of Onset  . Breast cancer Cousin 25  . Diabetes Cousin   . Diabetes Mother   . Diabetes Father   . Diabetes Sister   . Diabetes Brother   . Diabetes Maternal Aunt   . Diabetes Maternal Uncle   . Diabetes Paternal Aunt   . Diabetes Paternal Uncle     SOCIAL HISTORY Social History   Tobacco Use  .  Smoking status: Never Smoker  . Smokeless tobacco: Never Used  Substance Use Topics  . Alcohol use: No  . Drug use: No         OPHTHALMIC EXAM:  Base Eye Exam    Visual Acuity (Snellen - Linear)      Right Left   Dist [place] 20/60 +1 20/50 -1   Dist ph [place] 20/50 +2 20/50       Tonometry (Tonopen, 2:19 PM)      Right Left   Pressure 16 14       Pupils      Dark Light Shape React APD   Right 3 2 Round Brisk None   Left 3 2 Round Brisk None       Visual Fields (Counting fingers)      Left Right    Full Full       Extraocular Movement      Right Left    Full, Ortho Full, Ortho       Neuro/Psych    Oriented x3:  Yes   Mood/Affect:  Normal       Dilation    Both eyes:  1.0% Mydriacyl, 2.5% Phenylephrine @ 2:20 PM        Slit Lamp and Fundus Exam    Slit Lamp Exam      Right Left   Lids/Lashes Dermatochalasis - upper lid Dermatochalasis - upper lid   Conjunctiva/Sclera Nasal and temporal Pinguecula, Melanosis Nasal and temporal Pinguecula, Melanosis   Cornea Arcus, 1-2+ Punctate epithelial erosions Arcus, Inferior 2+ Punctate epithelial erosions   Anterior Chamber Deep and quiet Deep and quiet   Iris Round and moderately dilated to 5.69mm, no NVI Round and moderately dilated to 58mm, no NVI   Lens 2+ Nuclear sclerosis, 3+ Cortical cataract 2+ Nuclear sclerosis, 2-3+ Cortical cataract   Vitreous central VH  cleared, Posterior vitreous detachment, old VH inferiorly Vitreous syneresis, +RBC in anterior viterous, old VH -- clearing and settling inferiorly, Posterior vitreous detachment       Fundus Exam      Right Left   Disc Pink and Sharp, No NVD NVD w/ nasal elveation/fibrosis regressing, Pink and Sharp   C/D Ratio 0.1 0.1   Macula Blunted foveal reflex, Retinal pigment epithelial mottling, +cystic changes; scattered MA, Epiretinal membrane Flat, Blunted foveal reflex, Retinal pigment epithelial mottling, mild Epiretinal membrane, scattered Microaneurysms and DBH,  no exudate, +cystic change   Vessels mild attenuation, AV crossing changes, Copper wiring, mild Tortuousity Mild Vascular attenuation, mildly Tortuous, mild AV crossing changes, nasal NVE regressing   Periphery Attached, PRP scars 360  Attached, scattered DBH, 360 PRP scars           IMAGING AND PROCEDURES  Imaging and Procedures for 02/11/18  OCT, Retina - OU - Both Eyes       Right Eye Quality was good. Central Foveal Thickness: 264. Findings include normal foveal contour, no SRF, intraretinal fluid, epiretinal membrane (Persistent  IRF/cysts).   Left Eye Quality was good. Central Foveal Thickness: 260. Findings include normal foveal contour, no SRF, intraretinal fluid (Persistent IRF, mild persistent Vitreous opacities).   Notes *Images captured and stored on drive  Diagnosis / Impression:  OD: NFP, no SRF; persistent IRF OS: NFP, no SRF; persistent IRF, mild persistent vitreous opacities  Clinical management:  See below  Abbreviations: NFP - Normal foveal profile. CME - cystoid macular edema. PED - pigment epithelial detachment. IRF - intraretinal fluid. SRF - subretinal fluid. EZ - ellipsoid zone. ERM - epiretinal membrane. ORA - outer retinal atrophy. ORT - outer retinal tubulation. SRHM - subretinal hyper-reflective material                  ASSESSMENT/PLAN:    ICD-10-CM   1. Vitreous hemorrhage of right eye (HCC) H43.11   2. Proliferative diabetic retinopathy of both eyes with macular edema associated with type 2 diabetes mellitus ([place]) B51.0258   3. Vitreous hemorrhage, left eye (HCC) H43.12   4. Essential hypertension I10   5. Hypertensive retinopathy of both eyes H35.033   6. Retinal edema H35.81 OCT, Retina - OU - Both Eyes  7. Proliferative diabetic retinopathy of both eyes without macular edema associated with type 2 diabetes mellitus ([place]) N27.7824     1,2. Proliferative diabetic retinopathy OU - The incidence, risk factors for progression,  natural history and treatment options for diabetic retinopathy were discussed with patient.   - The need for close monitoring of blood glucose, blood pressure, and serum lipids, avoiding cigarette or any type of tobacco, and the need for long term follow up was also discussed with patient. - s/p PRP OU - s/p PRP fill-in OS (07.12.19), #2 (10.28.19) - S/P PRP fill-in OD (09.23.19) - history of bilateral vitreous heme -- OD now clear; OS clearing - S/P IVA OD #1(05.10.19), #2 (06.11.19), #3 (10.14.19) - s/p IVA OS #1 (09.13.19), #2 (10.14.19) - FA on 07.12.19 shows active NV OS with leakage - OCT shows +DME OU  - recommend IVA OU today, 11.25.19, however pt wishes to defer treatment for 1-2 wks - discussed risk of rebleed and/or worsening DME - f/u 1-2 weeks, DFE, OCT, possible injection OU  3. 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 Vitreous hemorrhage OS - new onset diffuse hemorrhage -- started 9.11.19 - today, slightly improved -- BCVA 20/50 OU - s/p IVA OS #1 (09.13.19), #2 (10.14.19) - s/p PRP fill-in OS (10.28.19) - recommend IVA #3 today, 11.25.19 - pt wishes to wait - VH clearing further today  5,6. Hypertensive retinopathy OU - discussed importance of tight BP control and its role in current VH symptoms - monitor  7. Combined form cataract OU - The symptoms of cataract, surgical options, and treatments and risks were discussed with patient. - discussed diagnosis and progression - approaching visual significance - under the expert care of Dr. Elliot Dally - will clear for cataract surgery once VHs remain resolved and DME stable     Ophthalmic Meds Ordered this visit:  No orders of the defined types were placed in this  encounter.      Return for F/U 1-2 weeks, VH OU.  There are no Patient Instructions on  file for this visit.   Explained the diagnoses, plan, and follow up with the patient and they expressed understanding.  Patient expressed understanding of the importance of proper follow up care.   This document serves as a record of services personally performed by Gardiner Sleeper, MD, PhD. It was created on their behalf by Ernest Mallick, OA, an ophthalmic assistant. The creation of this record is the provider's dictation and/or activities during the visit.    Electronically signed by: Ernest Mallick, OA  11.22.19 11:13 PM    Gardiner Sleeper, M.D., Ph.D. Diseases & Surgery of the Retina and Vitreous [facility]  I have reviewed the above documentation for accuracy and completeness, and I agree with the above. Gardiner Sleeper, M.D., Ph.D. 09/02/18 11:22 PM    Abbreviations: M myopia (nearsighted); A astigmatism; H hyperopia (farsighted); P presbyopia; Mrx spectacle prescription;  CTL contact lenses; OD right eye; OS left eye; OU both eyes  XT exotropia; ET esotropia; PEK punctate epithelial keratitis; PEE punctate epithelial erosions; DES dry eye syndrome; MGD meibomian gland dysfunction; ATs artificial tears; PFAT's preservative free artificial tears; [place] nuclear sclerotic cataract; PSC posterior subcapsular cataract; ERM epi-retinal membrane; PVD posterior vitreous detachment; RD retinal detachment; DM diabetes mellitus; DR diabetic retinopathy; NPDR non-proliferative diabetic retinopathy; PDR proliferative diabetic retinopathy; CSME clinically significant macular edema; DME diabetic macular edema; dbh dot blot hemorrhages; CWS cotton wool spot; POAG primary open angle glaucoma; C/D cup-to-disc ratio; HVF humphrey visual field; GVF goldmann visual field; OCT optical coherence tomography; IOP intraocular pressure; BRVO Branch retinal vein occlusion; CRVO central retinal vein  occlusion; CRAO central retinal artery occlusion; BRAO branch retinal artery occlusion; RT retinal tear; SB scleral buckle; PPV pars plana vitrectomy; VH Vitreous hemorrhage; PRP panretinal laser photocoagulation; IVK intravitreal kenalog; VMT vitreomacular traction; MH Macular hole;  NVD neovascularization of the disc; NVE neovascularization elsewhere; AREDS age related eye disease study; ARMD age related macular degeneration; POAG primary open angle glaucoma; EBMD epithelial/anterior basement membrane dystrophy; ACIOL anterior chamber intraocular lens; IOL intraocular lens; PCIOL posterior chamber intraocular lens; Phaco/IOL phacoemulsification with intraocular lens placement; [place] photorefractive keratectomy; LASIK laser assisted in situ keratomileusis; HTN hypertension; DM diabetes mellitus; COPD chronic obstructive pulmonary disease

## 2018-09-02 ENCOUNTER — Ambulatory Visit (INDEPENDENT_AMBULATORY_CARE_PROVIDER_SITE_OTHER): Payer: BLUE CROSS/BLUE SHIELD | Admitting: Ophthalmology

## 2018-09-02 ENCOUNTER — Encounter (INDEPENDENT_AMBULATORY_CARE_PROVIDER_SITE_OTHER): Payer: Self-pay | Admitting: Ophthalmology

## 2018-09-02 DIAGNOSIS — H4312 Vitreous hemorrhage, left eye: Secondary | ICD-10-CM | POA: Diagnosis not present

## 2018-09-02 DIAGNOSIS — E113513 Type 2 diabetes mellitus with proliferative diabetic retinopathy with macular edema, bilateral: Secondary | ICD-10-CM | POA: Diagnosis not present

## 2018-09-02 DIAGNOSIS — H25813 Combined forms of age-related cataract, bilateral: Secondary | ICD-10-CM

## 2018-09-02 DIAGNOSIS — H4311 Vitreous hemorrhage, right eye: Secondary | ICD-10-CM

## 2018-09-02 DIAGNOSIS — H3581 Retinal edema: Secondary | ICD-10-CM

## 2018-09-02 DIAGNOSIS — I10 Essential (primary) hypertension: Secondary | ICD-10-CM

## 2018-09-02 DIAGNOSIS — H35033 Hypertensive retinopathy, bilateral: Secondary | ICD-10-CM

## 2018-09-12 ENCOUNTER — Ambulatory Visit (INDEPENDENT_AMBULATORY_CARE_PROVIDER_SITE_OTHER): Payer: BLUE CROSS/BLUE SHIELD | Admitting: Podiatry

## 2018-09-12 DIAGNOSIS — B351 Tinea unguium: Secondary | ICD-10-CM | POA: Diagnosis not present

## 2018-09-12 DIAGNOSIS — M79675 Pain in left toe(s): Secondary | ICD-10-CM | POA: Diagnosis not present

## 2018-09-12 DIAGNOSIS — E1142 Type 2 diabetes mellitus with diabetic polyneuropathy: Secondary | ICD-10-CM | POA: Diagnosis not present

## 2018-09-12 DIAGNOSIS — L84 Corns and callosities: Secondary | ICD-10-CM | POA: Diagnosis not present

## 2018-09-12 DIAGNOSIS — M79674 Pain in right toe(s): Secondary | ICD-10-CM

## 2018-09-12 NOTE — Patient Instructions (Signed)

## 2018-09-16 ENCOUNTER — Encounter (INDEPENDENT_AMBULATORY_CARE_PROVIDER_SITE_OTHER): Payer: BLUE CROSS/BLUE SHIELD | Admitting: Ophthalmology

## 2018-10-08 ENCOUNTER — Ambulatory Visit
Admission: RE | Admit: 2018-10-08 | Discharge: 2018-10-08 | Disposition: A | Discharge status: Home / Self Care | Payer: BLUE CROSS/BLUE SHIELD | Source: Ambulatory Visit | Attending: Internal Medicine | Admitting: Internal Medicine

## 2018-10-08 DIAGNOSIS — Z1231 Encounter for screening mammogram for malignant neoplasm of breast: Secondary | ICD-10-CM

## 2018-10-12 ENCOUNTER — Encounter: Payer: Self-pay | Admitting: Podiatry

## 2018-10-12 NOTE — Progress Notes (Signed)
Subjective: Jenny Cook presents today with history of neuropathy with cc of painful, mycotic toenails.  Pain is aggravated when wearing enclosed shoe gear and relieved with periodic professional debridement.  Audley Hose, MD is her PCP and last dos was 08/21/2018.   Current Outpatient Medications:  .  acetaminophen (TYLENOL) 325 MG tablet, Take 650 mg by mouth every 6 (six) hours as needed., Disp: , Rfl:  .  amLODipine (NORVASC) 10 MG tablet, Take 10 mg by mouth every morning., Disp: , Rfl:  .  atorvastatin (LIPITOR) 20 MG tablet, , Disp: , Rfl:  .  hydrOXYzine (VISTARIL) 25 MG capsule, hydroxyzine pamoate 25 mg capsule, Disp: , Rfl:  .  Influenza vac split quadrivalent PF (FLUARIX QUADRIVALENT) 0.5 ML injection, Fluarix Quad 2018-2019 (PF) 60 mcg (15 mcg x 4)/0.5 mL IM syringe, Disp: , Rfl:  .  insulin NPH-regular Human (NOVOLIN 70/30) (70-30) 100 UNIT/ML injection, Novolin 70/30 U-100 Insulin  take 50u am/60u pm, Disp: , Rfl:  .  insulin NPH-regular Human (NOVOLIN 70/30) (70-30) 100 UNIT/ML injection, Novolin 70/30 U-100 Insulin  take 50u am/60u pm, Disp: , Rfl:  .  irbesartan-hydrochlorothiazide (AVALIDE) 150-12.5 MG tablet, , Disp: , Rfl:  .  irbesartan-hydrochlorothiazide (AVALIDE) 300-12.5 MG tablet, Take by mouth as directed., Disp: , Rfl:  .  ketoconazole (NIZORAL) 2 % cream, ketoconazole 2 % topical cream  APPLY TO THE AFFECTED AREA(S) BY TOPICAL ROUTE ONCE DAILY - between toes, Disp: , Rfl:  .  latanoprost (XALATAN) 0.005 % ophthalmic solution, Place 1 drop into both eyes at bedtime., Disp: , Rfl:  .  Latanoprost 0.005 % EMUL, latanoprost 0.005 % eye drops, Disp: , Rfl:  .  LATANOPROST OP, latanoprost 0.005 % eye drops, Disp: , Rfl:  .  NOVOLOG MIX 70/30 (70-30) 100 UNIT/ML injection, , Disp: , Rfl:  .  POLYETHYLENE GLYCOL 3350-GRX PO, polyethylene glycol 3350 17 gram oral powder packet, Disp: , Rfl:   Current Facility-Administered Medications:  .  Bevacizumab  (AVASTIN) SOLN 1.25 mg, 1.25 mg, Intravitreal, , Bernarda Caffey, MD, 1.25 mg at 02/18/18 0843 .  Bevacizumab (AVASTIN) SOLN 1.25 mg, 1.25 mg, Intravitreal, , Bernarda Caffey, MD, 1.25 mg at 03/20/18 2245 .  Bevacizumab (AVASTIN) SOLN 1.25 mg, 1.25 mg, Intravitreal, , Bernarda Caffey, MD, 1.25 mg at 06/21/18 1213 .  Bevacizumab (AVASTIN) SOLN 1.25 mg, 1.25 mg, Intravitreal, , Bernarda Caffey, MD, 1.25 mg at 07/22/18 2306 .  Bevacizumab (AVASTIN) SOLN 1.25 mg, 1.25 mg, Intravitreal, , Bernarda Caffey, MD, 1.25 mg at 07/22/18 2306  Allergies  Allergen Reactions  . Other     Objective:  Vascular Examination: Capillary refill time immediate x 10 digits Dorsalis pedis 1/4 b/l Posterior tibial pulses 1/4 b/l Digital hair x 10 digits was absent Skin temperature gradient WNL b/l  Dermatological Examination: Skin with normal turgor, texture and tone b/l  Toenails 1-5 b/l discolored, thick, dystrophic with subungual debris and pain with palpation to nailbeds due to thickness of nails.  Porokeratotic lesion submet head 5 b/l and distal aspect 5th digit right  Musculoskeletal: Muscle strength 5/5 to all muscle groups b/l  Neurological: Sensation with 10 gram monofilament is decreased b/l  Vibratory sensation absent b/l  Assessment: 1. Painful onychomycosis toenails 1-5 b/l 2. Porokeratotic lesions x 2 submet head 5th b/l 3. Corn distal 5th digit right 4. NIDDM with neuropathy  Plan: 1. Toenails 1-5 b/l were debrided in length and girth without iatrogenic bleeding. 2. Hyperkeratotic lesions pared submet head 5 b/l and distal  tip 5th digit right foot 3. Patient to continue soft, supportive shoe gear 4. Patient to report any pedal injuries to medical professional  5. Follow up 3 months. Patient/POA to call should there be a concern in the interim.

## 2018-11-21 DIAGNOSIS — E11319 Type 2 diabetes mellitus with unspecified diabetic retinopathy without macular edema: Secondary | ICD-10-CM | POA: Diagnosis not present

## 2018-11-21 DIAGNOSIS — H25813 Combined forms of age-related cataract, bilateral: Secondary | ICD-10-CM | POA: Diagnosis not present

## 2018-11-21 DIAGNOSIS — H3563 Retinal hemorrhage, bilateral: Secondary | ICD-10-CM | POA: Diagnosis not present

## 2018-11-21 DIAGNOSIS — E11311 Type 2 diabetes mellitus with unspecified diabetic retinopathy with macular edema: Secondary | ICD-10-CM | POA: Diagnosis not present

## 2018-11-21 DIAGNOSIS — H35352 Cystoid macular degeneration, left eye: Secondary | ICD-10-CM | POA: Diagnosis not present

## 2018-11-21 DIAGNOSIS — H35373 Puckering of macula, bilateral: Secondary | ICD-10-CM | POA: Diagnosis not present

## 2018-12-04 DIAGNOSIS — E1165 Type 2 diabetes mellitus with hyperglycemia: Secondary | ICD-10-CM | POA: Diagnosis not present

## 2018-12-04 DIAGNOSIS — E1142 Type 2 diabetes mellitus with diabetic polyneuropathy: Secondary | ICD-10-CM | POA: Diagnosis not present

## 2018-12-04 DIAGNOSIS — E782 Mixed hyperlipidemia: Secondary | ICD-10-CM | POA: Diagnosis not present

## 2018-12-04 DIAGNOSIS — G4733 Obstructive sleep apnea (adult) (pediatric): Secondary | ICD-10-CM | POA: Diagnosis not present

## 2018-12-12 ENCOUNTER — Ambulatory Visit: Payer: BLUE CROSS/BLUE SHIELD | Admitting: Podiatry

## 2019-05-07 DIAGNOSIS — E1142 Type 2 diabetes mellitus with diabetic polyneuropathy: Secondary | ICD-10-CM | POA: Diagnosis not present

## 2019-05-07 DIAGNOSIS — I10 Essential (primary) hypertension: Secondary | ICD-10-CM | POA: Diagnosis not present

## 2019-05-07 DIAGNOSIS — E782 Mixed hyperlipidemia: Secondary | ICD-10-CM | POA: Diagnosis not present

## 2019-05-07 DIAGNOSIS — E1165 Type 2 diabetes mellitus with hyperglycemia: Secondary | ICD-10-CM | POA: Diagnosis not present

## 2019-05-09 DIAGNOSIS — E1165 Type 2 diabetes mellitus with hyperglycemia: Secondary | ICD-10-CM | POA: Diagnosis not present

## 2019-05-09 DIAGNOSIS — I10 Essential (primary) hypertension: Secondary | ICD-10-CM | POA: Diagnosis not present

## 2019-05-09 DIAGNOSIS — E782 Mixed hyperlipidemia: Secondary | ICD-10-CM | POA: Diagnosis not present

## 2019-05-10 HISTORY — PX: CATARACT EXTRACTION: SUR2

## 2019-05-29 DIAGNOSIS — H43812 Vitreous degeneration, left eye: Secondary | ICD-10-CM | POA: Diagnosis not present

## 2019-05-29 DIAGNOSIS — H11153 Pinguecula, bilateral: Secondary | ICD-10-CM | POA: Diagnosis not present

## 2019-05-29 DIAGNOSIS — H40053 Ocular hypertension, bilateral: Secondary | ICD-10-CM | POA: Diagnosis not present

## 2019-05-29 DIAGNOSIS — H3563 Retinal hemorrhage, bilateral: Secondary | ICD-10-CM | POA: Diagnosis not present

## 2019-06-02 DIAGNOSIS — H25812 Combined forms of age-related cataract, left eye: Secondary | ICD-10-CM | POA: Diagnosis not present

## 2019-06-02 DIAGNOSIS — H2512 Age-related nuclear cataract, left eye: Secondary | ICD-10-CM | POA: Diagnosis not present

## 2019-06-02 DIAGNOSIS — H25012 Cortical age-related cataract, left eye: Secondary | ICD-10-CM | POA: Diagnosis not present

## 2019-06-17 DIAGNOSIS — I10 Essential (primary) hypertension: Secondary | ICD-10-CM | POA: Diagnosis not present

## 2019-06-17 DIAGNOSIS — E1165 Type 2 diabetes mellitus with hyperglycemia: Secondary | ICD-10-CM | POA: Diagnosis not present

## 2019-06-17 DIAGNOSIS — E782 Mixed hyperlipidemia: Secondary | ICD-10-CM | POA: Diagnosis not present

## 2019-06-17 DIAGNOSIS — G4733 Obstructive sleep apnea (adult) (pediatric): Secondary | ICD-10-CM | POA: Diagnosis not present

## 2019-08-19 DIAGNOSIS — E782 Mixed hyperlipidemia: Secondary | ICD-10-CM | POA: Diagnosis not present

## 2019-08-19 DIAGNOSIS — E113599 Type 2 diabetes mellitus with proliferative diabetic retinopathy without macular edema, unspecified eye: Secondary | ICD-10-CM | POA: Diagnosis not present

## 2019-08-19 DIAGNOSIS — G4733 Obstructive sleep apnea (adult) (pediatric): Secondary | ICD-10-CM | POA: Diagnosis not present

## 2019-08-19 DIAGNOSIS — Z1211 Encounter for screening for malignant neoplasm of colon: Secondary | ICD-10-CM | POA: Diagnosis not present

## 2019-08-19 DIAGNOSIS — Z01419 Encounter for gynecological examination (general) (routine) without abnormal findings: Secondary | ICD-10-CM | POA: Diagnosis not present

## 2019-08-19 DIAGNOSIS — E785 Hyperlipidemia, unspecified: Secondary | ICD-10-CM | POA: Diagnosis not present

## 2019-08-19 DIAGNOSIS — E1165 Type 2 diabetes mellitus with hyperglycemia: Secondary | ICD-10-CM | POA: Diagnosis not present

## 2019-08-19 DIAGNOSIS — Z1159 Encounter for screening for other viral diseases: Secondary | ICD-10-CM | POA: Diagnosis not present

## 2019-08-19 DIAGNOSIS — Z0001 Encounter for general adult medical examination with abnormal findings: Secondary | ICD-10-CM | POA: Diagnosis not present

## 2019-09-02 ENCOUNTER — Other Ambulatory Visit: Payer: Self-pay | Admitting: Internal Medicine

## 2019-09-02 DIAGNOSIS — Z1231 Encounter for screening mammogram for malignant neoplasm of breast: Secondary | ICD-10-CM

## 2019-09-23 ENCOUNTER — Encounter (INDEPENDENT_AMBULATORY_CARE_PROVIDER_SITE_OTHER): Payer: BC Managed Care – PPO | Admitting: Ophthalmology

## 2019-09-23 DIAGNOSIS — H35033 Hypertensive retinopathy, bilateral: Secondary | ICD-10-CM

## 2019-09-23 DIAGNOSIS — E113593 Type 2 diabetes mellitus with proliferative diabetic retinopathy without macular edema, bilateral: Secondary | ICD-10-CM

## 2019-09-23 DIAGNOSIS — H4312 Vitreous hemorrhage, left eye: Secondary | ICD-10-CM

## 2019-09-23 DIAGNOSIS — E113513 Type 2 diabetes mellitus with proliferative diabetic retinopathy with macular edema, bilateral: Secondary | ICD-10-CM

## 2019-09-23 DIAGNOSIS — I10 Essential (primary) hypertension: Secondary | ICD-10-CM

## 2019-09-23 DIAGNOSIS — H4311 Vitreous hemorrhage, right eye: Secondary | ICD-10-CM

## 2019-09-23 DIAGNOSIS — H25813 Combined forms of age-related cataract, bilateral: Secondary | ICD-10-CM

## 2019-09-23 DIAGNOSIS — H3581 Retinal edema: Secondary | ICD-10-CM

## 2019-11-03 ENCOUNTER — Other Ambulatory Visit: Payer: Self-pay

## 2019-11-03 ENCOUNTER — Ambulatory Visit
Admission: RE | Admit: 2019-11-03 | Discharge: 2019-11-03 | Disposition: A | Discharge status: Home / Self Care | Payer: BC Managed Care – PPO | Source: Ambulatory Visit | Attending: Internal Medicine | Admitting: Internal Medicine

## 2019-11-03 DIAGNOSIS — Z1231 Encounter for screening mammogram for malignant neoplasm of breast: Secondary | ICD-10-CM

## 2019-12-13 ENCOUNTER — Ambulatory Visit: Payer: BC Managed Care – PPO | Attending: Internal Medicine

## 2019-12-13 DIAGNOSIS — Z23 Encounter for immunization: Secondary | ICD-10-CM | POA: Insufficient documentation

## 2019-12-13 NOTE — Progress Notes (Signed)
   Covid-19 Vaccination Clinic  Name:  Jenny Cook    MRN: [id number] DOB: 1960/11/02  12/13/2019  [name] was observed post Covid-19 immunization for 15 minutes without incident. She was provided with Vaccine Information Sheet and instruction to access the V-Safe system.   [name] was instructed to call 911 with any severe reactions post vaccine: Marland Kitchen Difficulty breathing  . Swelling of face and throat  . A fast heartbeat  . A bad rash all over body  . Dizziness and weakness   Immunizations Administered    Name Date Dose VIS Date Route   Pfizer COVID-19 Vaccine 12/13/2019  9:29 AM 0.3 mL 09/19/2019 Intramuscular   Manufacturer: [place]   Lot: [id number]   [place]: [id number]

## 2019-12-29 DIAGNOSIS — R635 Abnormal weight gain: Secondary | ICD-10-CM | POA: Diagnosis not present

## 2019-12-29 DIAGNOSIS — I10 Essential (primary) hypertension: Secondary | ICD-10-CM | POA: Diagnosis not present

## 2019-12-29 DIAGNOSIS — E1165 Type 2 diabetes mellitus with hyperglycemia: Secondary | ICD-10-CM | POA: Diagnosis not present

## 2019-12-29 DIAGNOSIS — E785 Hyperlipidemia, unspecified: Secondary | ICD-10-CM | POA: Diagnosis not present

## 2020-01-03 ENCOUNTER — Ambulatory Visit: Payer: BC Managed Care – PPO | Attending: Internal Medicine

## 2020-01-03 DIAGNOSIS — Z23 Encounter for immunization: Secondary | ICD-10-CM

## 2020-01-03 NOTE — Progress Notes (Signed)
   Covid-19 Vaccination Clinic  Name:  Jenny Cook    MRN: [id number] DOB: 1961-01-12  01/03/2020  [name] was observed post Covid-19 immunization for 15 minutes without incident. She was provided with Vaccine Information Sheet and instruction to access the V-Safe system.   [name] was instructed to call 911 with any severe reactions post vaccine: Marland Kitchen Difficulty breathing  . Swelling of face and throat  . A fast heartbeat  . A bad rash all over body  . Dizziness and weakness   Immunizations Administered    Name Date Dose VIS Date Route   Pfizer COVID-19 Vaccine 01/03/2020  9:50 AM 0.3 mL 09/19/2019 Intramuscular   Manufacturer: [place]   Lot: [id number]   [place]: [id number]

## 2020-01-12 NOTE — Progress Notes (Addendum)
[facility] Note  01/14/2020     CHIEF COMPLAINT Patient presents for Retina Follow Up   HISTORY OF PRESENT ILLNESS: Jenny Cook is a 59 y.o. female who presents to the clinic today for:   HPI    Retina Follow Up    Patient presents with  Diabetic Retinopathy.  In both eyes.  Severity is moderate.  Duration of 16.5 months.  Since onset it is stable.  I, the attending physician,  performed the HPI with the patient and updated documentation appropriately.          Comments    Patient states had CE with IOL OS in August 2020 with Dr. Katy Fitch. Difficult to tell if any vision changes in either eye. BS was 141 this am. Last a1c was 8.9, checked 2 weeks ago. Using latanoprost qhs OU.         Last edited by Bernarda Caffey, MD on 01/14/2020 12:12 PM. (History)    pt has had cataract sx in her left eye since she was here last, she states she doesn't feel like it helped her vision very much   Referring physician: Clent Jacks, MD [address] [place],  [place] [address]  HISTORICAL INFORMATION:   Selected notes from the MEDICAL RECORD NUMBER Referred from Dr. Elliot Dally for concern of VH OU;   CURRENT MEDICATIONS: Current Outpatient Medications (Ophthalmic Drugs)  Medication Sig  . latanoprost (XALATAN) 0.005 % ophthalmic solution Place 1 drop into both eyes at bedtime.  . Latanoprost 0.005 % EMUL latanoprost 0.005 % eye drops  . LATANOPROST OP latanoprost 0.005 % eye drops   No current facility-administered medications for this visit. (Ophthalmic Drugs)   Current Outpatient Medications (Other)  Medication Sig  . acetaminophen (TYLENOL) 325 MG tablet Take 650 mg by mouth every 6 (six) hours as needed.  Marland Kitchen amLODipine (NORVASC) 10 MG tablet Take 10 mg by mouth every morning.  Marland Kitchen atorvastatin (LIPITOR) 20 MG tablet   . hydrOXYzine (VISTARIL) 25 MG capsule hydroxyzine pamoate 25 mg capsule  . insulin NPH-regular Human (NOVOLIN 70/30) (70-30) 100  UNIT/ML injection Novolin 70/30 U-100 Insulin  take 50u am/60u pm  . insulin NPH-regular Human (NOVOLIN 70/30) (70-30) 100 UNIT/ML injection Novolin 70/30 U-100 Insulin  take 50u am/60u pm  . irbesartan-hydrochlorothiazide (AVALIDE) 150-12.5 MG tablet   . irbesartan-hydrochlorothiazide (AVALIDE) 300-12.5 MG tablet Take by mouth as directed.  Marland Kitchen ketoconazole (NIZORAL) 2 % cream ketoconazole 2 % topical cream  APPLY TO THE AFFECTED AREA(S) BY TOPICAL ROUTE ONCE DAILY - between toes  . NOVOLOG MIX 70/30 (70-30) 100 UNIT/ML injection   . POLYETHYLENE GLYCOL 3350-GRX PO polyethylene glycol 3350 17 gram oral powder packet  . Influenza vac split quadrivalent PF (FLUARIX QUADRIVALENT) 0.5 ML injection Fluarix Quad 2018-2019 (PF) 60 mcg (15 mcg x 4)/0.5 mL IM syringe   Current Facility-Administered Medications (Other)  Medication Route  . Bevacizumab (AVASTIN) SOLN 1.25 mg Intravitreal  . Bevacizumab (AVASTIN) SOLN 1.25 mg Intravitreal  . Bevacizumab (AVASTIN) SOLN 1.25 mg Intravitreal  . Bevacizumab (AVASTIN) SOLN 1.25 mg Intravitreal  . Bevacizumab (AVASTIN) SOLN 1.25 mg Intravitreal      REVIEW OF SYSTEMS: ROS    Positive for: Endocrine, Cardiovascular, Eyes   Negative for: Constitutional, Gastrointestinal, Neurological, Skin, Genitourinary, Musculoskeletal, HENT, Respiratory, Psychiatric, Allergic/Imm, Heme/Lymph   Last edited by Roselee Nova D, COT on 01/14/2020 10:24 AM. (History)       ALLERGIES Allergies  Allergen Reactions  .  Other     PAST MEDICAL HISTORY Past Medical History:  Diagnosis Date  . Diabetes mellitus   . Hypertension    Past Surgical History:  Procedure Laterality Date  . CATARACT EXTRACTION Left 05/2019   Dr. Clent Jacks  . EYE SURGERY    . TUBAL LIGATION      FAMILY HISTORY Family History  Problem Relation Age of Onset  . Breast cancer Cousin 25  . Diabetes Cousin   . Diabetes Mother   . Diabetes Father   . Diabetes Sister   . Diabetes  Brother   . Diabetes Maternal Aunt   . Diabetes Maternal Uncle   . Diabetes Paternal Aunt   . Diabetes Paternal Uncle     SOCIAL HISTORY Social History   Tobacco Use  . Smoking status: Never Smoker  . Smokeless tobacco: Never Used  Substance Use Topics  . Alcohol use: No  . Drug use: No         OPHTHALMIC EXAM:  Base Eye Exam    Visual Acuity (Snellen - Linear)      Right Left   Dist [place] 20/40 -2 20/80 -2   Dist ph [place] NI NI       Tonometry (Tonopen, 10:33 AM)      Right Left   Pressure 24 19       Pupils      Dark Light Shape React APD   Right 3 2 Round Brisk None   Left 3 2 Round Brisk None       Extraocular Movement      Right Left    Full, Ortho Full, Ortho       Neuro/Psych    Oriented x3: Yes   Mood/Affect: Normal       Dilation    Both eyes: 1.0% Mydriacyl, 2.5% Phenylephrine @ 10:33 AM        Slit Lamp and Fundus Exam    Slit Lamp Exam      Right Left   Lids/Lashes Dermatochalasis - upper lid Dermatochalasis - upper lid   Conjunctiva/Sclera Nasal and temporal Pinguecula, Melanosis Nasal and temporal Pinguecula, Melanosis   Cornea Arcus, trace Punctate epithelial erosions Arcus, Inferior 2+ Punctate epithelial erosions, well healed temporal cataract wounds   Anterior Chamber Deep and quiet Deep and quiet   Iris Round and dilated, no NVI Round and dilated, no NVI   Lens 2+ Nuclear sclerosis, 3+ Cortical cataract PC IOL in good position, 2-3+ Posterior capsular opacification   Vitreous Mild syneresis, Posterior vitreous detachment, old VH inferiorly Vitreous syneresis, mild residual old VH       Fundus Exam      Right Left   Disc Pink and Sharp, No NVD, focal PPP temporally Pink and Sharp, mild residual fibrosis, no NVD   C/D Ratio 0.1 0.1   Macula Flat, Blunted foveal reflex, Retinal pigment epithelial mottling, No heme or edema Flat, good foveal reflex, +edema temporal to fovea, +focal IRH and exudates temporal mac   Vessels mild  attenuation, mild Tortuousity Vascular attenuation, mildly Tortuous   Periphery Attached, 360 PRP scars  Attached, scattered DBH, good 360 PRP in place, residual fibrosis in the IN quad        Refraction    Manifest Refraction      Sphere Cylinder Axis Dist VA   Right -0.25 +1.00 160 20/50-2   Left -0.75 +0.50 086 20/60          IMAGING AND PROCEDURES  Imaging and Procedures  for 02/11/18  OCT, Retina - OU - Both Eyes       Right Eye Quality was good. Central Foveal Thickness: 166. Progression has improved. Findings include normal foveal contour, no SRF, epiretinal membrane, no IRF (Broad foveal contour).   Left Eye Quality was good. Central Foveal Thickness: 269. Progression has worsened. Findings include normal foveal contour, no SRF, intraretinal fluid (Interval increase in IRF).   Notes *Images captured and stored on drive  Diagnosis / Impression:  OD: Broad foveal contour--interval resolution of IRF (last visit in 2019) OS: NFP, no SRF; interval increase in IRF  Clinical management:  See below  Abbreviations: NFP - Normal foveal profile. CME - cystoid macular edema. PED - pigment epithelial detachment. IRF - intraretinal fluid. SRF - subretinal fluid. EZ - ellipsoid zone. ERM - epiretinal membrane. ORA - outer retinal atrophy. ORT - outer retinal tubulation. SRHM - subretinal hyper-reflective material                  ASSESSMENT/PLAN:    ICD-10-CM   1. Proliferative diabetic retinopathy of both eyes with macular edema associated with type 2 diabetes mellitus (HCC)  H60.7371 CANCELED: Intravitreal Injection, Pharmacologic Agent - OD - Right Eye    CANCELED: Intravitreal Injection, Pharmacologic Agent - OS - Left Eye  2. Retinal edema  H35.81 OCT, Retina - OU - Both Eyes  3. Vitreous hemorrhage of right eye ([place])  H43.11   4. Vitreous hemorrhage, left eye ([place])  H43.12   5. Essential hypertension  I10   6. Hypertensive retinopathy of both eyes  H35.033    7. Combined forms of age-related cataract of both eyes  H25.813   8. Proliferative diabetic retinopathy of both eyes without macular edema associated with type 2 diabetes mellitus ([place])  G62.6948     1,2. Proliferative diabetic retinopathy OU  - pt has been lost to f/u since Nov 2019  - s/p PRP OU  - s/p PRP fill-in OS (07.12.19), #2 (10.28.19)  - S/P PRP fill-in OD (09.23.19)  - history of bilateral vitreous heme -- OD now clear; OS clearing  - S/P IVA OD #1(05.10.19), #2 (06.11.19), #3 (10.14.19)  - s/p IVA OS #1 (09.13.19), #2 (10.14.19)  - FA on 07.12.19 shows active NV OS with leakage  - OCT shows +DME OU   - recommend IVA OS today, 04.07.21  - pt wishes to defer treatment  - discussed risk of rebleed and/or worsening DME  - f/u 3 weeks, DFE, OCT, possible injection  3. Vitreous Hemorrhage OD  - acute onset VH OD -- started ~02/07/18  - now resolved  - likely secondary to PDR (see above) -- retina attached with PRP 360  - S/P IVA #1 OD (05.10.19), #2 (06.11.19), #3 (10.14.19)  4. Vitreous hemorrhage OS  - new onset diffuse hemorrhage -- started 9.11.19  - today, slightly improved -- BCVA 20/50 OU  - s/p IVA OS #1 (09.13.19), #2 (10.14.19)  - s/p PRP fill-in OS (10.28.19)  - VH clearing  5,6. Hypertensive retinopathy OU  - discussed importance of tight BP control and its role in current VH symptoms  - monitor  7. Combined form cataract OD  - The symptoms of cataract, surgical options, and treatments and risks were discussed with patient.  - discussed diagnosis and progression  - approaching visual significance  - under the expert care of Dr. Alfonso Patten. [place]  8. Pseudophakia OS  - s/p CE/IOL OS (Dr. Elliot Dally, 08.20)  - beautiful surgery, doing  well  - monitor      Ophthalmic Meds Ordered this visit:  No orders of the defined types were placed in this encounter.      Return in about 3 weeks (around 02/04/2020) for f/u PDR OU, DFE, OCT.  There are no Patient  Instructions on file for this visit.   Explained the diagnoses, plan, and follow up with the patient and they expressed understanding.  Patient expressed understanding of the importance of proper follow up care.   Gardiner Sleeper, M.D., Ph.D. Diseases & Surgery of the Retina and [place] 01/14/2020   I have reviewed the above documentation for accuracy and completeness, and I agree with the above. Gardiner Sleeper, M.D., Ph.D. 01/17/20 11:13 PM   Abbreviations: M myopia (nearsighted); A astigmatism; H hyperopia (farsighted); P presbyopia; Mrx spectacle prescription;  CTL contact lenses; OD right eye; OS left eye; OU both eyes  XT exotropia; ET esotropia; PEK punctate epithelial keratitis; PEE punctate epithelial erosions; DES dry eye syndrome; MGD meibomian gland dysfunction; ATs artificial tears; PFAT's preservative free artificial tears; [place] nuclear sclerotic cataract; PSC posterior subcapsular cataract; ERM epi-retinal membrane; PVD posterior vitreous detachment; RD retinal detachment; DM diabetes mellitus; DR diabetic retinopathy; NPDR non-proliferative diabetic retinopathy; PDR proliferative diabetic retinopathy; CSME clinically significant macular edema; DME diabetic macular edema; dbh dot blot hemorrhages; CWS cotton wool spot; POAG primary open angle glaucoma; C/D cup-to-disc ratio; HVF humphrey visual field; GVF goldmann visual field; OCT optical coherence tomography; IOP intraocular pressure; BRVO Branch retinal vein occlusion; CRVO central retinal vein occlusion; CRAO central retinal artery occlusion; BRAO branch retinal artery occlusion; RT retinal tear; SB scleral buckle; PPV pars plana vitrectomy; VH Vitreous hemorrhage; PRP panretinal laser photocoagulation; IVK intravitreal kenalog; VMT vitreomacular traction; MH Macular hole;  NVD neovascularization of the disc; NVE neovascularization elsewhere; AREDS age related eye disease study; ARMD age related  macular degeneration; POAG primary open angle glaucoma; EBMD epithelial/anterior basement membrane dystrophy; ACIOL anterior chamber intraocular lens; IOL intraocular lens; PCIOL posterior chamber intraocular lens; Phaco/IOL phacoemulsification with intraocular lens placement; [place] photorefractive keratectomy; LASIK laser assisted in situ keratomileusis; HTN hypertension; DM diabetes mellitus; COPD chronic obstructive pulmonary disease

## 2020-01-14 ENCOUNTER — Ambulatory Visit (INDEPENDENT_AMBULATORY_CARE_PROVIDER_SITE_OTHER): Payer: BC Managed Care – PPO | Admitting: Ophthalmology

## 2020-01-14 ENCOUNTER — Encounter (INDEPENDENT_AMBULATORY_CARE_PROVIDER_SITE_OTHER): Payer: Self-pay | Admitting: Ophthalmology

## 2020-01-14 DIAGNOSIS — H3581 Retinal edema: Secondary | ICD-10-CM | POA: Diagnosis not present

## 2020-01-14 DIAGNOSIS — H4311 Vitreous hemorrhage, right eye: Secondary | ICD-10-CM | POA: Diagnosis not present

## 2020-01-14 DIAGNOSIS — H4312 Vitreous hemorrhage, left eye: Secondary | ICD-10-CM | POA: Diagnosis not present

## 2020-01-14 DIAGNOSIS — I10 Essential (primary) hypertension: Secondary | ICD-10-CM

## 2020-01-14 DIAGNOSIS — H35033 Hypertensive retinopathy, bilateral: Secondary | ICD-10-CM

## 2020-01-14 DIAGNOSIS — H25813 Combined forms of age-related cataract, bilateral: Secondary | ICD-10-CM

## 2020-01-14 DIAGNOSIS — E113593 Type 2 diabetes mellitus with proliferative diabetic retinopathy without macular edema, bilateral: Secondary | ICD-10-CM

## 2020-01-14 DIAGNOSIS — E113513 Type 2 diabetes mellitus with proliferative diabetic retinopathy with macular edema, bilateral: Secondary | ICD-10-CM

## 2020-02-04 NOTE — Progress Notes (Signed)
[facility] Note  02/06/2020     CHIEF COMPLAINT Patient presents for Retina Follow Up   HISTORY OF PRESENT ILLNESS: UNKOWN Jenny Cook is a 59 y.o. female who presents to the clinic today for:   HPI    Retina Follow Up    Patient presents with  Diabetic Retinopathy.  In both eyes.  This started weeks ago.  Severity is moderate.  Duration of weeks.  Since onset it is stable.  I, the attending physician,  performed the HPI with the patient and updated documentation appropriately.          Comments    Pt states vision is the same since last visit.  Patient denies eye pain or discomfort and denies any new or worsening floaters or fol OU.       Last edited by Bernarda Caffey, MD on 02/06/2020 12:01 PM. (History)    pt is ready for avastin injection OS today, she states no changes to blood sugar or blood pressure today   Referring physician: Audley Hose, MD [place],  [place] [address]  HISTORICAL INFORMATION:   Selected notes from the MEDICAL RECORD NUMBER Referred from Dr. Elliot Dally for concern of VH OU;   CURRENT MEDICATIONS: Current Outpatient Medications (Ophthalmic Drugs)  Medication Sig  . latanoprost (XALATAN) 0.005 % ophthalmic solution Place 1 drop into both eyes at bedtime.  . Latanoprost 0.005 % EMUL latanoprost 0.005 % eye drops  . LATANOPROST OP latanoprost 0.005 % eye drops   No current facility-administered medications for this visit. (Ophthalmic Drugs)   Current Outpatient Medications (Other)  Medication Sig  . acetaminophen (TYLENOL) 325 MG tablet Take 650 mg by mouth every 6 (six) hours as needed.  Marland Kitchen amLODipine (NORVASC) 10 MG tablet Take 10 mg by mouth every morning.  Marland Kitchen atorvastatin (LIPITOR) 20 MG tablet   . hydrOXYzine (VISTARIL) 25 MG capsule hydroxyzine pamoate 25 mg capsule  . Influenza vac split quadrivalent PF (FLUARIX QUADRIVALENT) 0.5 ML injection Fluarix Quad 2018-2019 (PF) 60 mcg (15  mcg x 4)/0.5 mL IM syringe  . insulin NPH-regular Human (NOVOLIN 70/30) (70-30) 100 UNIT/ML injection Novolin 70/30 U-100 Insulin  take 50u am/60u pm  . insulin NPH-regular Human (NOVOLIN 70/30) (70-30) 100 UNIT/ML injection Novolin 70/30 U-100 Insulin  take 50u am/60u pm  . irbesartan-hydrochlorothiazide (AVALIDE) 150-12.5 MG tablet   . irbesartan-hydrochlorothiazide (AVALIDE) 300-12.5 MG tablet Take by mouth as directed.  Marland Kitchen ketoconazole (NIZORAL) 2 % cream ketoconazole 2 % topical cream  APPLY TO THE AFFECTED AREA(S) BY TOPICAL ROUTE ONCE DAILY - between toes  . NOVOLOG MIX 70/30 (70-30) 100 UNIT/ML injection   . POLYETHYLENE GLYCOL 3350-GRX PO polyethylene glycol 3350 17 gram oral powder packet   Current Facility-Administered Medications (Other)  Medication Route  . Bevacizumab (AVASTIN) SOLN 1.25 mg Intravitreal  . Bevacizumab (AVASTIN) SOLN 1.25 mg Intravitreal  . Bevacizumab (AVASTIN) SOLN 1.25 mg Intravitreal  . Bevacizumab (AVASTIN) SOLN 1.25 mg Intravitreal  . Bevacizumab (AVASTIN) SOLN 1.25 mg Intravitreal      REVIEW OF SYSTEMS: ROS    Positive for: Endocrine, Cardiovascular, Eyes   Negative for: Constitutional, Gastrointestinal, Neurological, Skin, Genitourinary, Musculoskeletal, HENT, Respiratory, Psychiatric, Allergic/Imm, Heme/Lymph   Last edited by Doneen Poisson on 02/06/2020  8:11 AM. (History)       ALLERGIES Allergies  Allergen Reactions  . Other     PAST MEDICAL HISTORY Past Medical History:  Diagnosis Date  . Diabetes  mellitus   . Hypertension    Past Surgical History:  Procedure Laterality Date  . CATARACT EXTRACTION Left 05/2019   Dr. Clent Jacks  . EYE SURGERY    . TUBAL LIGATION      FAMILY HISTORY Family History  Problem Relation Age of Onset  . Breast cancer Cousin 25  . Diabetes Cousin   . Diabetes Mother   . Diabetes Father   . Diabetes Sister   . Diabetes Brother   . Diabetes Maternal Aunt   . Diabetes Maternal Uncle   .  Diabetes Paternal Aunt   . Diabetes Paternal Uncle     SOCIAL HISTORY Social History   Tobacco Use  . Smoking status: Never Smoker  . Smokeless tobacco: Never Used  Substance Use Topics  . Alcohol use: No  . Drug use: No         OPHTHALMIC EXAM:  Base Eye Exam    Visual Acuity (Snellen - Linear)      Right Left   Dist [place] 20/40 -2 20/80 -2   Dist ph [place] 20/40 +1 20/70       Tonometry (Tonopen, 8:16 AM)      Right Left   Pressure 22 18       Pupils      Dark Light Shape React APD   Right 3 2 Round Brisk 0   Left 3 2 Round Brisk 0       Visual Fields      Left Right    Full Full       Extraocular Movement      Right Left    Full Full       Neuro/Psych    Oriented x3: Yes   Mood/Affect: Normal       Dilation    Both eyes: 1.0% Mydriacyl, 2.5% Phenylephrine @ 8:16 AM        Slit Lamp and Fundus Exam    Slit Lamp Exam      Right Left   Lids/Lashes Dermatochalasis - upper lid Dermatochalasis - upper lid   Conjunctiva/Sclera Nasal and temporal Pinguecula, Melanosis Nasal and temporal Pinguecula, Melanosis   Cornea Arcus, trace Punctate epithelial erosions Arcus, Inferior 2+ Punctate epithelial erosions, well healed temporal cataract wounds   Anterior Chamber Deep and quiet Deep and quiet   Iris Round and dilated, no NVI Round and dilated, no NVI   Lens 2+ Nuclear sclerosis, 3+ Cortical cataract PC IOL in good position, 2-3+ Posterior capsular opacification   Vitreous Mild syneresis, Posterior vitreous detachment, old VH inferiorly Vitreous syneresis, mild residual old VH       Fundus Exam      Right Left   Disc Pink and Sharp, No NVD, focal PPP temporally, Compact Pink and Sharp, mild residual fibrosis, no NVD, Compact   C/D Ratio 0.1 0.1   Macula Flat, good foveal reflex, Retinal pigment epithelial mottling, No heme or edema Flat, good foveal reflex, mild cystic changes/IRH   Vessels mild attenuation, mild Tortuousity Vascular attenuation, mildly  Tortuous   Periphery Attached, 360 PRP scars  Attached, scattered DBH, good 360 PRP in place, residual fibrosis in the IN quad          IMAGING AND PROCEDURES  Imaging and Procedures for 02/11/18  OCT, Retina - OU - Both Eyes       Right Eye Quality was good. Central Foveal Thickness: 171. Progression has been stable. Findings include normal foveal contour, no SRF, epiretinal membrane, no IRF (  Broad foveal contour).   Left Eye Quality was good. Central Foveal Thickness: 226. Progression has improved. Findings include normal foveal contour, no SRF, intraretinal fluid (Mild Interval improvement in IRF).   Notes *Images captured and stored on drive  Diagnosis / Impression:  OD: Broad foveal contour--interval resolution of IRF (last visit in 2019) OS: NFP, no SRF; Mild Interval improvement in IRF  Clinical management:  See below  Abbreviations: NFP - Normal foveal profile. CME - cystoid macular edema. PED - pigment epithelial detachment. IRF - intraretinal fluid. SRF - subretinal fluid. EZ - ellipsoid zone. ERM - epiretinal membrane. ORA - outer retinal atrophy. ORT - outer retinal tubulation. SRHM - subretinal hyper-reflective material         Intravitreal Injection, Pharmacologic Agent - OS - Left Eye       Time Out 02/06/2020. 8:22 AM. Confirmed correct patient, procedure, site, and patient consented.   Anesthesia Topical anesthesia was used. Anesthetic medications included Lidocaine 2%, Proparacaine 0.5%.   Procedure Preparation included 5% betadine to ocular surface, eyelid speculum. A (32g) needle was used.   Injection:  1.25 mg Bevacizumab (AVASTIN) SOLN   NDC: [id number], Lot: [id number] , Expiration date: 05/04/2020   Route: Intravitreal, Site: Left Eye, Waste: 0 mL  Post-op Post injection exam found visual acuity of at least counting fingers. The patient tolerated the procedure well. There were no complications. The patient received written and verbal  post procedure care education.                 ASSESSMENT/PLAN:    ICD-10-CM   1. Proliferative diabetic retinopathy of both eyes with macular edema associated with type 2 diabetes mellitus (HCC)  05/17/2020 Intravitreal Injection, Pharmacologic Agent - OS - Left Eye    Bevacizumab (AVASTIN) SOLN 1.25 mg  2. Retinal edema  H35.81 OCT, Retina - OU - Both Eyes  3. Vitreous hemorrhage of right eye ([place])  H43.11   4. Vitreous hemorrhage, left eye ([place])  H43.12   5. Essential hypertension  I10   6. Hypertensive retinopathy of both eyes  H35.033   7. Combined forms of age-related cataract of both eyes  H25.813   8. Proliferative diabetic retinopathy of both eyes without macular edema associated with type 2 diabetes mellitus ([place])  [address]     1,2. Proliferative diabetic retinopathy OU  - pt has been lost to f/u since Nov 2019  - s/p PRP OU  - s/p PRP fill-in OS (07.12.19), #2 (10.28.19)  - S/P PRP fill-in OD (09.23.19)  - history of bilateral vitreous heme -- now clear OU  - S/P IVA OD #1(05.10.19), #2 (06.11.19), #3 (10.14.19)  - s/p IVA OS #1 (09.13.19), #2 (10.14.19)  - FA on 07.12.19 shows active NV OS with leakage  - BCVA 20/40 OD, 20/70 OS  - OCT shows mild interval improvement in IRF OS from last visit  - recommend IVA OS #3 today, 04.30.21, for DME  - pt wishes to proceed  - RBA of procedure discussed, questions answered  - informed consent obtained   - Avastin informed consent form signed and scanned on 04.30.2021  - see procedure note  - f/u 4 weeks, DFE, OCT, possible injection  3. Vitreous Hemorrhage OD  - acute onset VH OD -- started ~02/07/18  - now resolved  - likely secondary to PDR (see above) -- retina attached with PRP 360  - S/P IVA #1 OD (05.10.19), #2 (06.11.19), #3 (10.14.19)  4. Vitreous hemorrhage OS  - onset diffuse  hemorrhage -- started 9.11.19  - improved today  - s/p IVA OS #1 (09.13.19), #2 (10.14.19)  - s/p PRP fill-in OS (10.28.19)  5,6.  Hypertensive retinopathy OU  - discussed importance of tight BP control and its role in current VH symptoms  - monitor  7. Combined form cataract OD  - The symptoms of cataract, surgical options, and treatments and risks were discussed with patient.  - discussed diagnosis and progression  - approaching visual significance  - under the expert care of Dr. Alfonso Patten. [place]  8. Pseudophakia OS  - s/p CE/IOL OS (Dr. Elliot Dally, 08.20)  - IOL in perfect position  - 2-3+ PCO  - pt interested in YAG cap  - monitor  Ophthalmic Meds Ordered this visit:  Meds ordered this encounter  Medications  . Bevacizumab (AVASTIN) SOLN 1.25 mg       Return in about 4 weeks (around 03/05/2020) for f/u PDR OU, DFE, OCT.  There are no Patient Instructions on file for this visit.   Explained the diagnoses, plan, and follow up with the patient and they expressed understanding.  Patient expressed understanding of the importance of proper follow up care.   This document serves as a record of services personally performed by Gardiner Sleeper, MD, PhD. It was created on their behalf by Ernest Mallick, OA, an ophthalmic assistant. The creation of this record is the provider's dictation and/or activities during the visit.    Electronically signed by: Ernest Mallick, OA 04.28.2021 12:09 PM   Gardiner Sleeper, M.D., Ph.D. Diseases & Surgery of the Retina and Vitreous [facility]  I have reviewed the above documentation for accuracy and completeness, and I agree with the above. Gardiner Sleeper, M.D., Ph.D. 02/06/20 12:09 PM    Abbreviations: M myopia (nearsighted); A astigmatism; H hyperopia (farsighted); P presbyopia; Mrx spectacle prescription;  CTL contact lenses; OD right eye; OS left eye; OU both eyes  XT exotropia; ET esotropia; PEK punctate epithelial keratitis; PEE punctate epithelial erosions; DES dry eye syndrome; MGD meibomian gland dysfunction; ATs artificial tears; PFAT's preservative  free artificial tears; [place] nuclear sclerotic cataract; PSC posterior subcapsular cataract; ERM epi-retinal membrane; PVD posterior vitreous detachment; RD retinal detachment; DM diabetes mellitus; DR diabetic retinopathy; NPDR non-proliferative diabetic retinopathy; PDR proliferative diabetic retinopathy; CSME clinically significant macular edema; DME diabetic macular edema; dbh dot blot hemorrhages; CWS cotton wool spot; POAG primary open angle glaucoma; C/D cup-to-disc ratio; HVF humphrey visual field; GVF goldmann visual field; OCT optical coherence tomography; IOP intraocular pressure; BRVO Branch retinal vein occlusion; CRVO central retinal vein occlusion; CRAO central retinal artery occlusion; BRAO branch retinal artery occlusion; RT retinal tear; SB scleral buckle; PPV pars plana vitrectomy; VH Vitreous hemorrhage; PRP panretinal laser photocoagulation; IVK intravitreal kenalog; VMT vitreomacular traction; MH Macular hole;  NVD neovascularization of the disc; NVE neovascularization elsewhere; AREDS age related eye disease study; ARMD age related macular degeneration; POAG primary open angle glaucoma; EBMD epithelial/anterior basement membrane dystrophy; ACIOL anterior chamber intraocular lens; IOL intraocular lens; PCIOL posterior chamber intraocular lens; Phaco/IOL phacoemulsification with intraocular lens placement; [place] photorefractive keratectomy; LASIK laser assisted in situ keratomileusis; HTN hypertension; DM diabetes mellitus; COPD chronic obstructive pulmonary disease

## 2020-02-05 ENCOUNTER — Encounter: Payer: Self-pay | Admitting: Orthopaedic Surgery

## 2020-02-05 ENCOUNTER — Other Ambulatory Visit: Payer: Self-pay

## 2020-02-05 ENCOUNTER — Ambulatory Visit (INDEPENDENT_AMBULATORY_CARE_PROVIDER_SITE_OTHER): Payer: BC Managed Care – PPO | Admitting: Orthopaedic Surgery

## 2020-02-05 ENCOUNTER — Ambulatory Visit: Payer: Self-pay

## 2020-02-05 DIAGNOSIS — M1711 Unilateral primary osteoarthritis, right knee: Secondary | ICD-10-CM

## 2020-02-05 MED ORDER — LIDOCAINE HCL 1 % IJ SOLN
2.0000 mL | INTRAMUSCULAR | Status: AC | PRN
  Administered 2020-02-05: 11:00:00 2 mL

## 2020-02-05 MED ORDER — BUPIVACAINE HCL 0.5 % IJ SOLN
2.0000 mL | INTRAMUSCULAR | Status: AC | PRN
  Administered 2020-02-05: 2 mL via INTRA_ARTICULAR

## 2020-02-05 MED ORDER — METHYLPREDNISOLONE ACETATE 40 MG/ML IJ SUSP
40.0000 mg | INTRAMUSCULAR | Status: AC | PRN
  Administered 2020-02-05: 40 mg via INTRA_ARTICULAR

## 2020-02-05 NOTE — Progress Notes (Signed)
Office Visit Note   Patient: Jenny Cook           Date of Birth: 19-Jun-1961           MRN: [id number] Visit Date: 02/05/2020              Requested by: Audley Hose, MD [place],  [place] [address] PCP: Audley Hose, MD   Assessment & Plan: Visit Diagnoses:  1. Primary osteoarthritis of right knee     Plan: Impression is right knee DJD with effusion.  16 cc of joint fluid aspirated from the knee and cortisone injected today.  Compression wrap was applied.  I gave her handout on Monovisc.  We talked about the importance of weight loss.  She will obtain knee compression brace.  Follow-up as needed.  Follow-Up Instructions: Return if symptoms worsen or fail to improve.   Orders:  Orders Placed This Encounter  Procedures  . Large Joint Inj  . XR KNEE 3 VIEW RIGHT   No orders of the defined types were placed in this encounter.     Procedures: Large Joint Inj: R knee on 02/05/2020 10:50 AM Indications: pain Details: 22 G needle  Arthrogram: No  Medications: 40 mg methylPREDNISolone acetate 40 MG/ML; 2 mL lidocaine 1 %; 2 mL bupivacaine 0.5 % Consent was given by the patient. Patient was prepped and draped in the usual sterile fashion.       Clinical Data: No additional findings.   Subjective: Chief Complaint  Patient presents with  . Right Knee - Pain    Jenny Cook is a very pleasant 59 year old female who I saw about 3 and half years ago for chronic right knee pain.  I aspirated and injected her knee at that time and she received significant relief from this.  Recently she states that the swelling is back and she has trouble bending her knee.  She also has significant pain with prolonged standing and walking on concrete floors at her job.  It feels like the knee wants to give out.  Denies any injuries.  Denies any constitutional symptoms.  She takes Aleve for the pain.   Review of Systems  Constitutional: Negative.     HENT: Negative.   Eyes: Negative.   Respiratory: Negative.   Cardiovascular: Negative.   Endocrine: Negative.   Musculoskeletal: Negative.   Neurological: Negative.   Hematological: Negative.   Psychiatric/Behavioral: Negative.   All other systems reviewed and are negative.    Objective: Vital Signs: LMP 08/23/2013   Physical Exam Vitals and nursing note reviewed.  Constitutional:      Appearance: She is well-developed.  HENT:     Head: Normocephalic and atraumatic.  Pulmonary:     Effort: Pulmonary effort is normal.  Abdominal:     Palpations: Abdomen is soft.  Musculoskeletal:     Cervical back: Neck supple.  Skin:    General: Skin is warm.     Capillary Refill: Capillary refill takes less than 2 seconds.  Neurological:     Mental Status: She is alert and oriented to person, place, and time.  Psychiatric:        Behavior: Behavior normal.        Thought Content: Thought content normal.        Judgment: Judgment normal.     Ortho Exam Right knee shows a small joint effusion.  Slight limitation range of motion due to the effusion.  Collaterals and cruciates are  stable. Specialty Comments:  No specialty comments available.  Imaging: XR KNEE 3 VIEW RIGHT  Result Date: 02/05/2020 Advanced tricompartmental DJD    PMFS History: Patient Active Problem List   Diagnosis Date Noted  . Polyneuropathy due to type 2 diabetes mellitus ([place]) 03/27/2018  . Type II diabetes mellitus, uncontrolled ([place]) 01/08/2018  . Mixed hyperlipidemia 12/20/2017  . Diabetes mellitus ([place]) 12/11/2017  . Hypertensive disorder 12/11/2017  . Unilateral primary osteoarthritis, right knee 08/25/2016  . Chronic pain of right knee 08/25/2016   Past Medical History:  Diagnosis Date  . Diabetes mellitus   . Hypertension     Family History  Problem Relation Age of Onset  . Breast cancer Cousin 25  . Diabetes Cousin   . Diabetes Mother   . Diabetes Father   . Diabetes Sister   .  Diabetes Brother   . Diabetes Maternal Aunt   . Diabetes Maternal Uncle   . Diabetes Paternal Aunt   . Diabetes Paternal Uncle     Past Surgical History:  Procedure Laterality Date  . CATARACT EXTRACTION Left 05/2019   Dr. Clent Jacks  . EYE SURGERY    . TUBAL LIGATION     Social History   Occupational History  . Not on file  Tobacco Use  . Smoking status: Never Smoker  . Smokeless tobacco: Never Used  Substance and Sexual Activity  . Alcohol use: No  . Drug use: No  . Sexual activity: Not Currently

## 2020-02-06 ENCOUNTER — Ambulatory Visit (INDEPENDENT_AMBULATORY_CARE_PROVIDER_SITE_OTHER): Payer: BC Managed Care – PPO | Admitting: Ophthalmology

## 2020-02-06 ENCOUNTER — Encounter (INDEPENDENT_AMBULATORY_CARE_PROVIDER_SITE_OTHER): Payer: Self-pay | Admitting: Ophthalmology

## 2020-02-06 DIAGNOSIS — H25813 Combined forms of age-related cataract, bilateral: Secondary | ICD-10-CM

## 2020-02-06 DIAGNOSIS — H4312 Vitreous hemorrhage, left eye: Secondary | ICD-10-CM

## 2020-02-06 DIAGNOSIS — H3581 Retinal edema: Secondary | ICD-10-CM | POA: Diagnosis not present

## 2020-02-06 DIAGNOSIS — H4311 Vitreous hemorrhage, right eye: Secondary | ICD-10-CM

## 2020-02-06 DIAGNOSIS — H35033 Hypertensive retinopathy, bilateral: Secondary | ICD-10-CM

## 2020-02-06 DIAGNOSIS — I10 Essential (primary) hypertension: Secondary | ICD-10-CM

## 2020-02-06 DIAGNOSIS — E113593 Type 2 diabetes mellitus with proliferative diabetic retinopathy without macular edema, bilateral: Secondary | ICD-10-CM

## 2020-02-06 DIAGNOSIS — E113513 Type 2 diabetes mellitus with proliferative diabetic retinopathy with macular edema, bilateral: Secondary | ICD-10-CM

## 2020-02-06 MED ORDER — BEVACIZUMAB CHEMO INJECTION 1.25MG/0.05ML SYRINGE FOR KALEIDOSCOPE
1.2500 mg | INTRAVITREAL | Status: AC | PRN
  Administered 2020-02-06: 1.25 mg via INTRAVITREAL

## 2020-03-05 ENCOUNTER — Encounter (INDEPENDENT_AMBULATORY_CARE_PROVIDER_SITE_OTHER): Payer: BC Managed Care – PPO | Admitting: Ophthalmology

## 2020-03-17 NOTE — Progress Notes (Signed)
[facility] Note  03/19/2020     CHIEF COMPLAINT Patient presents for Retina Follow Up   HISTORY OF PRESENT ILLNESS: Jenny Cook is a 59 y.o. female who presents to the clinic today for:   HPI    Retina Follow Up    Patient presents with  Diabetic Retinopathy.  In both eyes.  This started years ago.  Severity is moderate.  Duration of 6 weeks.  Since onset it is gradually improving.  I, the attending physician,  performed the HPI with the patient and updated documentation appropriately.          Comments    59 y/o female pt here for 6 wk f/u for PDR OU.  No change in [place] OU noticed.  Denies pain, FOL, floaters.  Latanoprost QHS OU.  BS 287 last night.  A1C 8.1 3 mos ago.       Last edited by Bernarda Caffey, MD on 03/24/2020 12:32 AM. (History)       Referring physician: Clent Jacks, MD [address] [place],  [place] [address]  HISTORICAL INFORMATION:   Selected notes from the MEDICAL RECORD NUMBER Referred from Dr. Elliot Dally for concern of VH OU;   CURRENT MEDICATIONS: Current Outpatient Medications (Ophthalmic Drugs)  Medication Sig  . latanoprost (XALATAN) 0.005 % ophthalmic solution Place 1 drop into both eyes at bedtime.  . Latanoprost 0.005 % EMUL latanoprost 0.005 % eye drops (Patient not taking: Reported on 03/19/2020)  . LATANOPROST OP latanoprost 0.005 % eye drops (Patient not taking: Reported on 03/19/2020)   No current facility-administered medications for this visit. (Ophthalmic Drugs)   Current Outpatient Medications (Other)  Medication Sig  . acetaminophen (TYLENOL) 325 MG tablet Take 650 mg by mouth every 6 (six) hours as needed.  Marland Kitchen amLODipine (NORVASC) 10 MG tablet Take 10 mg by mouth every morning.  Marland Kitchen atorvastatin (LIPITOR) 20 MG tablet   . Dulaglutide (TRULICITY) 3 ZJ/6.9CV SOPN Trulicity 3 EL/3.8 mL subcutaneous pen injector  . hydrOXYzine (VISTARIL) 25 MG capsule hydroxyzine pamoate 25 mg capsule  . Influenza  vac split quadrivalent PF (FLUARIX QUADRIVALENT) 0.5 ML injection Fluarix Quad 2018-2019 (PF) 60 mcg (15 mcg x 4)/0.5 mL IM syringe  . insulin NPH-regular Human (NOVOLIN 70/30) (70-30) 100 UNIT/ML injection Novolin 70/30 U-100 Insulin  take 50u am/60u pm  . irbesartan-hydrochlorothiazide (AVALIDE) 300-12.5 MG tablet Take by mouth as directed.  Marland Kitchen ketoconazole (NIZORAL) 2 % cream ketoconazole 2 % topical cream  APPLY TO THE AFFECTED AREA(S) BY TOPICAL ROUTE ONCE DAILY - between toes  . NOVOLOG MIX 70/30 (70-30) 100 UNIT/ML injection   . POLYETHYLENE GLYCOL 3350-GRX PO polyethylene glycol 3350 17 gram oral powder packet  . insulin NPH-regular Human (NOVOLIN 70/30) (70-30) 100 UNIT/ML injection Novolin 70/30 U-100 Insulin  take 50u am/60u pm (Patient not taking: Reported on 03/19/2020)  . irbesartan-hydrochlorothiazide (AVALIDE) 150-12.5 MG tablet  (Patient not taking: Reported on 03/19/2020)   Current Facility-Administered Medications (Other)  Medication Route  . Bevacizumab (AVASTIN) SOLN 1.25 mg Intravitreal  . Bevacizumab (AVASTIN) SOLN 1.25 mg Intravitreal  . Bevacizumab (AVASTIN) SOLN 1.25 mg Intravitreal  . Bevacizumab (AVASTIN) SOLN 1.25 mg Intravitreal  . Bevacizumab (AVASTIN) SOLN 1.25 mg Intravitreal      REVIEW OF SYSTEMS: ROS    Positive for: Neurological, Endocrine, Eyes   Negative for: Constitutional, Gastrointestinal, Skin, Genitourinary, Musculoskeletal, HENT, Cardiovascular, Respiratory, Psychiatric, Allergic/Imm, Heme/Lymph   Last edited by Matthew Folks, COA on  03/19/2020  9:12 AM. (History)       ALLERGIES Allergies  Allergen Reactions  . Other     PAST MEDICAL HISTORY Past Medical History:  Diagnosis Date  . Cataract    Combined form OD  . Diabetes mellitus   . Diabetic retinopathy ([place])    PDR OU  . Hypertension   . Hypertensive retinopathy    OU   Past Surgical History:  Procedure Laterality Date  . CATARACT EXTRACTION Left 05/2019   Dr. Clent Jacks  . EYE SURGERY Left    Cat Sx - Dr. Elliot Dally  . TUBAL LIGATION      FAMILY HISTORY Family History  Problem Relation Age of Onset  . Breast cancer Cousin 25  . Diabetes Cousin   . Diabetes Mother   . Diabetes Father   . Diabetes Sister   . Diabetes Brother   . Diabetes Maternal Aunt   . Diabetes Maternal Uncle   . Diabetes Paternal Aunt   . Diabetes Paternal Uncle     SOCIAL HISTORY Social History   Tobacco Use  . Smoking status: Never Smoker  . Smokeless tobacco: Never Used  Vaping Use  . Vaping Use: Never used  Substance Use Topics  . Alcohol use: No  . Drug use: No         OPHTHALMIC EXAM:  Base Eye Exam    Visual Acuity (Snellen - Linear)      Right Left   Dist [place] 20/40 -2 20/50 -2   Dist ph [place] NI NI       Tonometry (Tonopen, 9:20 AM)      Right Left   Pressure 18 21       Pupils      Dark Light Shape React APD   Right 3 2 Round Brisk None   Left 3 2 Round Brisk None       Visual Fields (Counting fingers)      Left Right    Full Full       Extraocular Movement      Right Left    Full, Ortho Full, Ortho       Neuro/Psych    Oriented x3: Yes   Mood/Affect: Normal       Dilation    Both eyes: 1.0% Mydriacyl, 2.5% Phenylephrine @ 9:20 AM        Slit Lamp and Fundus Exam    Slit Lamp Exam      Right Left   Lids/Lashes Dermatochalasis - upper lid Dermatochalasis - upper lid   Conjunctiva/Sclera Nasal and temporal Pinguecula, Melanosis Nasal and temporal Pinguecula, Melanosis   Cornea Arcus, trace Punctate epithelial erosions Arcus, Inferior 2+ Punctate epithelial erosions, well healed temporal cataract wounds   Anterior Chamber Deep and quiet Deep and quiet   Iris Round and dilated, no NVI Round and dilated, no NVI   Lens 2+ Nuclear sclerosis, 3+ Cortical cataract PC IOL in good position, 2-3+ Posterior capsular opacification   Vitreous Mild syneresis, Posterior vitreous detachment, old VH inferiorly Vitreous syneresis, mild  residual old VH       Fundus Exam      Right Left   Disc Pink and Sharp, No NVD, focal PPP temporally, Compact Pink and Sharp, mild residual fibrosis, no NVD, Compact   C/D Ratio 0.1 0.1   Macula Flat, good foveal reflex, Retinal pigment epithelial mottling, No heme or edema Flat, good foveal reflex, +edema--mild improvement from prior   Vessels mild attenuation, mild  Tortuousity Vascular attenuation, mildly Tortuous   Periphery Attached, 360 PRP scars  Attached, scattered DBH, good 360 PRP in place, residual fibrosis in the IN quad          IMAGING AND PROCEDURES  Imaging and Procedures for 02/11/18  OCT, Retina - OU - Both Eyes       Right Eye Quality was good. Central Foveal Thickness: 168. Progression has been stable. Findings include normal foveal contour, no SRF, epiretinal membrane, no IRF (Broad foveal contour, mild cystic changes I/T mac).   Left Eye Quality was good. Central Foveal Thickness: 230. Progression has improved. Findings include normal foveal contour, no SRF, intraretinal fluid (Persistent cystic changes temp mac).   Notes *Images captured and stored on drive  Diagnosis / Impression:  OD: Broad foveal contour--mild cystic changes in/temp OS: NFP, no SRF; persistent cystic changes temporally  Clinical management:  See below  Abbreviations: NFP - Normal foveal profile. CME - cystoid macular edema. PED - pigment epithelial detachment. IRF - intraretinal fluid. SRF - subretinal fluid. EZ - ellipsoid zone. ERM - epiretinal membrane. ORA - outer retinal atrophy. ORT - outer retinal tubulation. SRHM - subretinal hyper-reflective material                  ASSESSMENT/PLAN:    ICD-10-CM   1. Proliferative diabetic retinopathy of both eyes with macular edema associated with type 2 diabetes mellitus ([place])  B15.1761   2. Retinal edema  H35.81 OCT, Retina - OU - Both Eyes  3. Vitreous hemorrhage of right eye ([place])  H43.11   4. Vitreous hemorrhage, left  eye ([place])  H43.12   5. Essential hypertension  I10   6. Hypertensive retinopathy of both eyes  H35.033   7. Combined forms of age-related cataract of both eyes  H25.813   8. Proliferative diabetic retinopathy of both eyes without macular edema associated with type 2 diabetes mellitus ([place])  Y07.3710     1,2. Proliferative diabetic retinopathy OU  - pt was lost to f/u from Nov 2019 to Apr 2021  - s/p PRP OU  - s/p PRP fill-in OS (07.12.19), #2 (10.28.19)  - S/P PRP fill-in OD (09.23.19)  - history of bilateral vitreous heme -- now clear OU  - S/P IVA OD #1(05.10.19), #2 (06.11.19), #3 (10.14.19)  - s/p IVA OS #1 (09.13.19), #2 (10.14.19), #3 (04.30.21)  - FA on 07.12.19 shows active NV OS with leakage  - BCVA 20/40 OD, 20/70 OS  - OCT shows persistent IRF OS -- mild interval improvement  - recommend IVA OS #4 today, 06.11.21, for DME  - Pt does not want to move forward with injection today               Pt wishes to see Dr. Katy Fitch for Yag PC OS, prior to moving forward with another injection             - Risks explained in detail with refusal of IVA OS today, 06.11.21  - f/u ASAP when ready to receive anti VEGF therapy -- DFE, OCT, possible injection  3. Vitreous Hemorrhage OD  - acute onset VH OD -- started ~02/07/18  - now resolved  - likely secondary to PDR (see above) -- retina attached with PRP 360  - S/P IVA #1 OD (05.10.19), #2 (06.11.19), #3 (10.14.19)  4. Vitreous hemorrhage OS  - onset diffuse hemorrhage -- started 9.11.19  - improved today  - s/p IVA OS #1 (09.13.19), #2 (10.14.19)  - s/p  PRP fill-in OS (10.28.19)  5,6. Hypertensive retinopathy OU  - discussed importance of tight BP control and its role in current VH symptoms  - monitor  7. Combined form cataract OD  - The symptoms of cataract, surgical options, and treatments and risks were discussed with patient.  - discussed diagnosis and progression  - approaching visual significance  - under the expert care of  Dr. Alfonso Patten. [place]  8. Pseudophakia OS  - s/p CE/IOL OS (Dr. Elliot Dally, 08.20)  - IOL in perfect position  - 2-3+ PCO  - pt interested in YAG cap  - monitor  Ophthalmic Meds Ordered this visit:  No orders of the defined types were placed in this encounter.      No follow-ups on file.  There are no Patient Instructions on file for this visit.   Explained the diagnoses, plan, and follow up with the patient and they expressed understanding.  Patient expressed understanding of the importance of proper follow up care.   This document serves as a record of services personally performed by Gardiner Sleeper, MD, PhD. It was created on their behalf by Ernest Mallick, OA, an ophthalmic assistant. The creation of this record is the provider's dictation and/or activities during the visit.    Electronically signed by: Ernest Mallick, OA 06.09.2021 12:37 AM   Gardiner Sleeper, M.D., Ph.D. Diseases & Surgery of the Retina and Vitreous [facility]  I have reviewed the above documentation for accuracy and completeness, and I agree with the above. Gardiner Sleeper, M.D., Ph.D. 03/24/20 12:37 AM   Abbreviations: M myopia (nearsighted); A astigmatism; H hyperopia (farsighted); P presbyopia; Mrx spectacle prescription;  CTL contact lenses; OD right eye; OS left eye; OU both eyes  XT exotropia; ET esotropia; PEK punctate epithelial keratitis; PEE punctate epithelial erosions; DES dry eye syndrome; MGD meibomian gland dysfunction; ATs artificial tears; PFAT's preservative free artificial tears; [place] nuclear sclerotic cataract; PSC posterior subcapsular cataract; ERM epi-retinal membrane; PVD posterior vitreous detachment; RD retinal detachment; DM diabetes mellitus; DR diabetic retinopathy; NPDR non-proliferative diabetic retinopathy; PDR proliferative diabetic retinopathy; CSME clinically significant macular edema; DME diabetic macular edema; dbh dot blot hemorrhages; CWS cotton wool spot; POAG  primary open angle glaucoma; C/D cup-to-disc ratio; HVF humphrey visual field; GVF goldmann visual field; OCT optical coherence tomography; IOP intraocular pressure; BRVO Branch retinal vein occlusion; CRVO central retinal vein occlusion; CRAO central retinal artery occlusion; BRAO branch retinal artery occlusion; RT retinal tear; SB scleral buckle; PPV pars plana vitrectomy; VH Vitreous hemorrhage; PRP panretinal laser photocoagulation; IVK intravitreal kenalog; VMT vitreomacular traction; MH Macular hole;  NVD neovascularization of the disc; NVE neovascularization elsewhere; AREDS age related eye disease study; ARMD age related macular degeneration; POAG primary open angle glaucoma; EBMD epithelial/anterior basement membrane dystrophy; ACIOL anterior chamber intraocular lens; IOL intraocular lens; PCIOL posterior chamber intraocular lens; Phaco/IOL phacoemulsification with intraocular lens placement; [place] photorefractive keratectomy; LASIK laser assisted in situ keratomileusis; HTN hypertension; DM diabetes mellitus; COPD chronic obstructive pulmonary disease

## 2020-03-19 ENCOUNTER — Other Ambulatory Visit: Payer: Self-pay

## 2020-03-19 ENCOUNTER — Ambulatory Visit (INDEPENDENT_AMBULATORY_CARE_PROVIDER_SITE_OTHER): Payer: BC Managed Care – PPO | Admitting: Ophthalmology

## 2020-03-19 ENCOUNTER — Encounter (INDEPENDENT_AMBULATORY_CARE_PROVIDER_SITE_OTHER): Payer: Self-pay | Admitting: Ophthalmology

## 2020-03-19 DIAGNOSIS — I10 Essential (primary) hypertension: Secondary | ICD-10-CM

## 2020-03-19 DIAGNOSIS — H4311 Vitreous hemorrhage, right eye: Secondary | ICD-10-CM | POA: Diagnosis not present

## 2020-03-19 DIAGNOSIS — H3581 Retinal edema: Secondary | ICD-10-CM

## 2020-03-19 DIAGNOSIS — E113513 Type 2 diabetes mellitus with proliferative diabetic retinopathy with macular edema, bilateral: Secondary | ICD-10-CM | POA: Diagnosis not present

## 2020-03-19 DIAGNOSIS — H35033 Hypertensive retinopathy, bilateral: Secondary | ICD-10-CM

## 2020-03-19 DIAGNOSIS — H4312 Vitreous hemorrhage, left eye: Secondary | ICD-10-CM | POA: Diagnosis not present

## 2020-03-19 DIAGNOSIS — H25813 Combined forms of age-related cataract, bilateral: Secondary | ICD-10-CM

## 2020-03-19 DIAGNOSIS — E113593 Type 2 diabetes mellitus with proliferative diabetic retinopathy without macular edema, bilateral: Secondary | ICD-10-CM

## 2020-03-24 ENCOUNTER — Encounter (INDEPENDENT_AMBULATORY_CARE_PROVIDER_SITE_OTHER): Payer: Self-pay | Admitting: Ophthalmology

## 2020-03-31 DIAGNOSIS — H26492 Other secondary cataract, left eye: Secondary | ICD-10-CM | POA: Diagnosis not present

## 2020-04-09 DIAGNOSIS — R635 Abnormal weight gain: Secondary | ICD-10-CM | POA: Diagnosis not present

## 2020-04-09 DIAGNOSIS — E113599 Type 2 diabetes mellitus with proliferative diabetic retinopathy without macular edema, unspecified eye: Secondary | ICD-10-CM | POA: Diagnosis not present

## 2020-04-09 DIAGNOSIS — I10 Essential (primary) hypertension: Secondary | ICD-10-CM | POA: Diagnosis not present

## 2020-04-09 DIAGNOSIS — E1165 Type 2 diabetes mellitus with hyperglycemia: Secondary | ICD-10-CM | POA: Diagnosis not present

## 2020-04-23 ENCOUNTER — Encounter (INDEPENDENT_AMBULATORY_CARE_PROVIDER_SITE_OTHER): Payer: BC Managed Care – PPO | Admitting: Ophthalmology

## 2020-05-12 NOTE — Progress Notes (Addendum)
[facility] Note  05/14/2020     CHIEF COMPLAINT Patient presents for Retina Follow Up   HISTORY OF PRESENT ILLNESS: Jenny Cook is a 59 y.o. female who presents to the clinic today for:   HPI    Retina Follow Up    Patient presents with  Diabetic Retinopathy.  In both eyes.  This started weeks ago.  Severity is moderate.  Duration of weeks.  Since onset it is stable.  I, the attending physician,  performed the HPI with the patient and updated documentation appropriately.          Comments    BS: 345 this AM A1c: 8.2 Pt states vision is the same OU.  Pt denies eye pain or discomfot.  Patient denies any new or worsening floaters or fol OU.       Last edited by Bernarda Caffey, MD on 05/16/2020 12:21 AM. (History)    pt states her vision is okay sometimes, but mostly it is blurry, pt has YAG OS with Dr. Katy Fitch   Referring physician: Audley Hose, MD [place],  [place] [address]  HISTORICAL INFORMATION:   Selected notes from the MEDICAL RECORD NUMBER Referred from Dr. Elliot Dally for concern of VH OU;   CURRENT MEDICATIONS: Current Outpatient Medications (Ophthalmic Drugs)  Medication Sig  . latanoprost (XALATAN) 0.005 % ophthalmic solution Place 1 drop into both eyes at bedtime.  . Latanoprost 0.005 % EMUL latanoprost 0.005 % eye drops (Patient not taking: Reported on 03/19/2020)  . LATANOPROST OP latanoprost 0.005 % eye drops (Patient not taking: Reported on 03/19/2020)   No current facility-administered medications for this visit. (Ophthalmic Drugs)   Current Outpatient Medications (Other)  Medication Sig  . acetaminophen (TYLENOL) 325 MG tablet Take 650 mg by mouth every 6 (six) hours as needed.  Marland Kitchen amLODipine (NORVASC) 10 MG tablet Take 10 mg by mouth every morning.  Marland Kitchen atorvastatin (LIPITOR) 20 MG tablet   . Dulaglutide (TRULICITY) 3 TF/5.7DU SOPN Trulicity 3 KG/2.5 mL subcutaneous pen injector  . hydrOXYzine  (VISTARIL) 25 MG capsule hydroxyzine pamoate 25 mg capsule  . Influenza vac split quadrivalent PF (FLUARIX QUADRIVALENT) 0.5 ML injection Fluarix Quad 2018-2019 (PF) 60 mcg (15 mcg x 4)/0.5 mL IM syringe  . insulin NPH-regular Human (NOVOLIN 70/30) (70-30) 100 UNIT/ML injection Novolin 70/30 U-100 Insulin  take 50u am/60u pm  . insulin NPH-regular Human (NOVOLIN 70/30) (70-30) 100 UNIT/ML injection Novolin 70/30 U-100 Insulin  take 50u am/60u pm (Patient not taking: Reported on 03/19/2020)  . irbesartan-hydrochlorothiazide (AVALIDE) 150-12.5 MG tablet  (Patient not taking: Reported on 03/19/2020)  . irbesartan-hydrochlorothiazide (AVALIDE) 300-12.5 MG tablet Take by mouth as directed.  Marland Kitchen ketoconazole (NIZORAL) 2 % cream ketoconazole 2 % topical cream  APPLY TO THE AFFECTED AREA(S) BY TOPICAL ROUTE ONCE DAILY - between toes  . NOVOLOG MIX 70/30 (70-30) 100 UNIT/ML injection   . POLYETHYLENE GLYCOL 3350-GRX PO polyethylene glycol 3350 17 gram oral powder packet   Current Facility-Administered Medications (Other)  Medication Route  . Bevacizumab (AVASTIN) SOLN 1.25 mg Intravitreal  . Bevacizumab (AVASTIN) SOLN 1.25 mg Intravitreal  . Bevacizumab (AVASTIN) SOLN 1.25 mg Intravitreal  . Bevacizumab (AVASTIN) SOLN 1.25 mg Intravitreal  . Bevacizumab (AVASTIN) SOLN 1.25 mg Intravitreal      REVIEW OF SYSTEMS: ROS    Positive for: Neurological, Endocrine, Eyes   Negative for: Constitutional, Gastrointestinal, Skin, Genitourinary, Musculoskeletal, HENT, Cardiovascular, Respiratory, Psychiatric, Allergic/Imm, Heme/Lymph  Last edited by Doneen Poisson on 05/14/2020  9:48 AM. (History)       ALLERGIES Allergies  Allergen Reactions  . Other     PAST MEDICAL HISTORY Past Medical History:  Diagnosis Date  . Cataract    Combined form OD  . Diabetes mellitus   . Diabetic retinopathy ([place])    PDR OU  . Hypertension   . Hypertensive retinopathy    OU   Past Surgical History:   Procedure Laterality Date  . CATARACT EXTRACTION Left 05/2019   Dr. Clent Jacks  . EYE SURGERY Left    Cat Sx - Dr. Elliot Dally  . TUBAL LIGATION      FAMILY HISTORY Family History  Problem Relation Age of Onset  . Breast cancer Cousin 25  . Diabetes Cousin   . Diabetes Mother   . Diabetes Father   . Diabetes Sister   . Diabetes Brother   . Diabetes Maternal Aunt   . Diabetes Maternal Uncle   . Diabetes Paternal Aunt   . Diabetes Paternal Uncle     SOCIAL HISTORY Social History   Tobacco Use  . Smoking status: Never Smoker  . Smokeless tobacco: Never Used  Vaping Use  . Vaping Use: Never used  Substance Use Topics  . Alcohol use: No  . Drug use: No         OPHTHALMIC EXAM:  Base Eye Exam    Visual Acuity (Snellen - Linear)      Right Left   Dist [place] 20/50 -1 20/50 +2   Dist ph [place] 20/40 -2 20/40 -2       Tonometry (Tonopen, 9:53 AM)      Right Left   Pressure 23 20  Squeezing       Pupils      Dark Light Shape React APD   Right 4 3 Round Brisk 0   Left 4 3 Round Brisk 0       Visual Fields      Left Right    Full Full       Extraocular Movement      Right Left    Full Full       Neuro/Psych    Oriented x3: Yes   Mood/Affect: Normal       Dilation    Both eyes: 1.0% Mydriacyl, 2.5% Phenylephrine @ 9:53 AM        Slit Lamp and Fundus Exam    Slit Lamp Exam      Right Left   Lids/Lashes Dermatochalasis - upper lid Dermatochalasis - upper lid   Conjunctiva/Sclera Nasal and temporal Pinguecula, Melanosis Nasal and temporal Pinguecula, Melanosis   Cornea Arcus, trace Punctate epithelial erosions Arcus, well healed temporal cataract wounds   Anterior Chamber Deep and quiet, narrow angles Deep and quiet   Iris Round and dilated, no NVI Round and dilated, no NVI, focal atrophy at 0230   Lens 2+ Nuclear sclerosis, 3+ Cortical cataract PC IOL in good position with open PC   Vitreous Mild syneresis, Posterior vitreous detachment, old VH  inferiorly Vitreous syneresis, mild residual old VH       Fundus Exam      Right Left   Disc Pink and Sharp, No NVD, focal PPP temporally, Compact Pink and Sharp, mild residual fibrosis, no NVD, Compact   C/D Ratio 0.1 0.1   Macula Flat, good foveal reflex, Retinal pigment epithelial mottling, trace ERM, No heme or edema Flat, good foveal reflex, +edema--mild  increase from prior   Vessels mild attenuation, mild Tortuous Vascular attenuation, mildly Tortuous   Periphery Attached, 360 PRP scars  Attached, scattered DBH, good 360 PRP in place, residual fibrosis in the IN quad          IMAGING AND PROCEDURES  Imaging and Procedures for 02/11/18  OCT, Retina - OU - Both Eyes       Right Eye Quality was good. Central Foveal Thickness: 166. Progression has been stable. Findings include normal foveal contour, no SRF, epiretinal membrane, no IRF (Broad foveal contour).   Left Eye Quality was good. Central Foveal Thickness: 238. Progression has worsened. Findings include normal foveal contour, no SRF, intraretinal fluid (Interval increase in IRF centrally and temporal macula).   Notes *Images captured and stored on drive  Diagnosis / Impression:  OD: Broad foveal contour OS: NFP, no SRF; Interval increase in IRF centrally and temporal macula  Clinical management:  See below  Abbreviations: NFP - Normal foveal profile. CME - cystoid macular edema. PED - pigment epithelial detachment. IRF - intraretinal fluid. SRF - subretinal fluid. EZ - ellipsoid zone. ERM - epiretinal membrane. ORA - outer retinal atrophy. ORT - outer retinal tubulation. SRHM - subretinal hyper-reflective material         Intravitreal Injection, Pharmacologic Agent - OS - Left Eye       Time Out 05/14/2020. 11:16 AM. Confirmed correct patient, procedure, site, and patient consented.   Anesthesia Topical anesthesia was used. Anesthetic medications included Lidocaine 2%, Proparacaine 0.5%.    Procedure Preparation included 5% betadine to ocular surface, eyelid speculum. A supplied needle was used.   Injection:  1.25 mg Bevacizumab (AVASTIN) SOLN   NDC: 41937-902-40, Lot: [id number] , Expiration date: 06/02/2020   Route: Intravitreal, Site: Left Eye, Waste: 0 mL  Post-op Post injection exam found visual acuity of at least counting fingers. The patient tolerated the procedure well. There were no complications. The patient received written and verbal post procedure care education. Post injection medications were not given.                 ASSESSMENT/PLAN:    ICD-10-CM   1. Proliferative diabetic retinopathy of both eyes with macular edema associated with type 2 diabetes mellitus (HCC)  06/15/2020 Intravitreal Injection, Pharmacologic Agent - OS - Left Eye    Bevacizumab (AVASTIN) SOLN 1.25 mg  2. Retinal edema  H35.81 OCT, Retina - OU - Both Eyes  3. Vitreous hemorrhage of right eye ([place])  H43.11   4. Vitreous hemorrhage, left eye ([place])  H43.12   5. Essential hypertension  I10   6. Hypertensive retinopathy of both eyes  H35.033   7. Combined forms of age-related cataract of both eyes  H25.813     1,2. Proliferative diabetic retinopathy OU  - pt was lost to f/u from Nov 2019 to Apr 2021  - s/p PRP OU  - s/p PRP fill-in OS (07.12.19), #2 (10.28.19)  - S/P PRP fill-in OD (09.23.19)  - history of bilateral vitreous heme -- now clear OU  - S/P IVA OD #1(05.10.19), #2 (06.11.19), #3 (10.14.19)  - s/p IVA OS #1 (09.13.19), #2 (10.14.19), #3 (04.30.21)  - FA on 07.12.19 shows active NV OS with leakage  - BCVA 20/40 OU -- OS improved post YAG cap  - OCT shows persistent IRF OS -- mild interval worsening following pt declining injection on 06.16.21  - recommend IVA OS #4 today, 08.06.21, for DME  - pt wishes to proceed with injection  -  RBA of procedure discussed, questions answered  - informed consent obtained  - Avastin informed consent form signed and scanned on  04.30.2021  - see procedure note  - f/u 4-6 weeks -- DFE, OCT, possible injection  3. Vitreous Hemorrhage OD  - acute onset VH OD -- started ~02/07/18  - now resolved  - likely secondary to PDR (see above) -- retina attached with PRP 360  - S/P IVA #1 OD (05.10.19), #2 (06.11.19), #3 (10.14.19)  4. Vitreous hemorrhage OS  - onset diffuse hemorrhage -- started 9.11.19  - improved today  - s/p IVA OS #1 (09.13.19), #2 (10.14.19)  - s/p PRP fill-in OS (10.28.19)  5,6. Hypertensive retinopathy OU  - discussed importance of tight BP control and its role in current VH symptoms  - monitor  7. Combined form cataract OD  - The symptoms of cataract, surgical options, and treatments and risks were discussed with patient.  - discussed diagnosis and progression  - approaching visual significance  - under the expert care of Dr. Alfonso Patten. [place]  8. Pseudophakia OS  - s/p CE/IOL OS (Dr. Elliot Dally, 08.20)  - s/p YAG cap (Dr. Elliot Dally)  - monitor  Ophthalmic Meds Ordered this visit:  Meds ordered this encounter  Medications  . Bevacizumab (AVASTIN) SOLN 1.25 mg       Return for 4-6 wks - f/u DME, Dilated Exam, OCT, Possible Injxn.  There are no Patient Instructions on file for this visit.   Explained the diagnoses, plan, and follow up with the patient and they expressed understanding.  Patient expressed understanding of the importance of proper follow up care.   This document serves as a record of services personally performed by Gardiner Sleeper, MD, PhD. It was created on their behalf by San Jetty. Owens Shark, OA an ophthalmic technician. The creation of this record is the provider's dictation and/or activities during the visit.    Electronically signed by: San Jetty. Owens Shark, [place] 08.04.2021 12:25 AM   Gardiner Sleeper, M.D., Ph.D. Diseases & Surgery of the Retina and Vitreous [facility]  I have reviewed the above documentation for accuracy and completeness, and I agree with  the above. Gardiner Sleeper, M.D., Ph.D. 05/16/20 12:25 AM   Abbreviations: M myopia (nearsighted); A astigmatism; H hyperopia (farsighted); P presbyopia; Mrx spectacle prescription;  CTL contact lenses; OD right eye; OS left eye; OU both eyes  XT exotropia; ET esotropia; PEK punctate epithelial keratitis; PEE punctate epithelial erosions; DES dry eye syndrome; MGD meibomian gland dysfunction; ATs artificial tears; PFAT's preservative free artificial tears; [place] nuclear sclerotic cataract; PSC posterior subcapsular cataract; ERM epi-retinal membrane; PVD posterior vitreous detachment; RD retinal detachment; DM diabetes mellitus; DR diabetic retinopathy; NPDR non-proliferative diabetic retinopathy; PDR proliferative diabetic retinopathy; CSME clinically significant macular edema; DME diabetic macular edema; dbh dot blot hemorrhages; CWS cotton wool spot; POAG primary open angle glaucoma; C/D cup-to-disc ratio; HVF humphrey visual field; GVF goldmann visual field; OCT optical coherence tomography; IOP intraocular pressure; BRVO Branch retinal vein occlusion; CRVO central retinal vein occlusion; CRAO central retinal artery occlusion; BRAO branch retinal artery occlusion; RT retinal tear; SB scleral buckle; PPV pars plana vitrectomy; VH Vitreous hemorrhage; PRP panretinal laser photocoagulation; IVK intravitreal kenalog; VMT vitreomacular traction; MH Macular hole;  NVD neovascularization of the disc; NVE neovascularization elsewhere; AREDS age related eye disease study; ARMD age related macular degeneration; POAG primary open angle glaucoma; EBMD epithelial/anterior basement membrane dystrophy; ACIOL anterior chamber intraocular lens; IOL intraocular  lens; PCIOL posterior chamber intraocular lens; Phaco/IOL phacoemulsification with intraocular lens placement; [place] photorefractive keratectomy; LASIK laser assisted in situ keratomileusis; HTN hypertension; DM diabetes mellitus; COPD chronic obstructive pulmonary  disease

## 2020-05-14 ENCOUNTER — Other Ambulatory Visit: Payer: Self-pay

## 2020-05-14 ENCOUNTER — Ambulatory Visit (INDEPENDENT_AMBULATORY_CARE_PROVIDER_SITE_OTHER): Payer: BC Managed Care – PPO | Admitting: Ophthalmology

## 2020-05-14 DIAGNOSIS — H4312 Vitreous hemorrhage, left eye: Secondary | ICD-10-CM | POA: Diagnosis not present

## 2020-05-14 DIAGNOSIS — H4311 Vitreous hemorrhage, right eye: Secondary | ICD-10-CM

## 2020-05-14 DIAGNOSIS — E113513 Type 2 diabetes mellitus with proliferative diabetic retinopathy with macular edema, bilateral: Secondary | ICD-10-CM | POA: Diagnosis not present

## 2020-05-14 DIAGNOSIS — I10 Essential (primary) hypertension: Secondary | ICD-10-CM

## 2020-05-14 DIAGNOSIS — H3581 Retinal edema: Secondary | ICD-10-CM

## 2020-05-14 DIAGNOSIS — H35033 Hypertensive retinopathy, bilateral: Secondary | ICD-10-CM

## 2020-05-14 DIAGNOSIS — H25813 Combined forms of age-related cataract, bilateral: Secondary | ICD-10-CM

## 2020-05-16 ENCOUNTER — Encounter (INDEPENDENT_AMBULATORY_CARE_PROVIDER_SITE_OTHER): Payer: Self-pay | Admitting: Ophthalmology

## 2020-05-16 MED ORDER — BEVACIZUMAB CHEMO INJECTION 1.25MG/0.05ML SYRINGE FOR KALEIDOSCOPE
1.2500 mg | INTRAVITREAL | Status: AC | PRN
  Administered 2020-05-16: 1.25 mg via INTRAVITREAL

## 2020-06-18 ENCOUNTER — Encounter (INDEPENDENT_AMBULATORY_CARE_PROVIDER_SITE_OTHER): Payer: BC Managed Care – PPO | Admitting: Ophthalmology

## 2020-07-19 DIAGNOSIS — E1165 Type 2 diabetes mellitus with hyperglycemia: Secondary | ICD-10-CM | POA: Diagnosis not present

## 2020-07-19 DIAGNOSIS — E785 Hyperlipidemia, unspecified: Secondary | ICD-10-CM | POA: Diagnosis not present

## 2020-07-19 DIAGNOSIS — Z1329 Encounter for screening for other suspected endocrine disorder: Secondary | ICD-10-CM | POA: Diagnosis not present

## 2020-07-23 DIAGNOSIS — Z23 Encounter for immunization: Secondary | ICD-10-CM | POA: Diagnosis not present

## 2020-07-23 DIAGNOSIS — Z01419 Encounter for gynecological examination (general) (routine) without abnormal findings: Secondary | ICD-10-CM | POA: Diagnosis not present

## 2020-07-23 DIAGNOSIS — Z0001 Encounter for general adult medical examination with abnormal findings: Secondary | ICD-10-CM | POA: Diagnosis not present

## 2020-07-23 DIAGNOSIS — E1165 Type 2 diabetes mellitus with hyperglycemia: Secondary | ICD-10-CM | POA: Diagnosis not present

## 2020-07-23 DIAGNOSIS — M1711 Unilateral primary osteoarthritis, right knee: Secondary | ICD-10-CM | POA: Diagnosis not present

## 2020-07-23 DIAGNOSIS — I10 Essential (primary) hypertension: Secondary | ICD-10-CM | POA: Diagnosis not present

## 2020-07-23 DIAGNOSIS — Z1211 Encounter for screening for malignant neoplasm of colon: Secondary | ICD-10-CM | POA: Diagnosis not present

## 2020-09-15 NOTE — Progress Notes (Signed)
[facility] Note  09/17/2020     CHIEF COMPLAINT Patient presents for Retina Follow Up   HISTORY OF PRESENT ILLNESS: Jenny Cook is a 59 y.o. female who presents to the clinic today for:   HPI    Retina Follow Up    Patient presents with  Diabetic Retinopathy.  In both eyes.  This started months ago.  Duration of weeks.  Since onset it is stable.  I, the attending physician,  performed the HPI with the patient and updated documentation appropriately.          Comments    Pt states vision is the same OU.  Pt denies eye pain or discomfort and denies any new or worsening floaters or fol OU.       Last edited by Bernarda Caffey, MD on 09/17/2020 12:17 PM. (History)    Pt feels VA OU is about the same.   Referring physician: Clent Jacks, MD [address] [place],  [place] [address]  HISTORICAL INFORMATION:   Selected notes from the MEDICAL RECORD NUMBER Referred from Dr. Elliot Dally for concern of VH OU;   CURRENT MEDICATIONS: Current Outpatient Medications (Ophthalmic Drugs)  Medication Sig  . latanoprost (XALATAN) 0.005 % ophthalmic solution Place 1 drop into both eyes at bedtime.  . Latanoprost 0.005 % EMUL latanoprost 0.005 % eye drops (Patient not taking: Reported on 03/19/2020)  . LATANOPROST OP latanoprost 0.005 % eye drops (Patient not taking: Reported on 03/19/2020)   No current facility-administered medications for this visit. (Ophthalmic Drugs)   Current Outpatient Medications (Other)  Medication Sig  . acetaminophen (TYLENOL) 325 MG tablet Take 650 mg by mouth every 6 (six) hours as needed.  Marland Kitchen amLODipine (NORVASC) 10 MG tablet Take 10 mg by mouth every morning.  Marland Kitchen atorvastatin (LIPITOR) 20 MG tablet   . Dulaglutide (TRULICITY) 3 YN/8.2NF SOPN Trulicity 3 AO/1.3 mL subcutaneous pen injector  . hydrOXYzine (VISTARIL) 25 MG capsule hydroxyzine pamoate 25 mg capsule  . Influenza vac split quadrivalent PF (FLUARIX QUADRIVALENT)  0.5 ML injection Fluarix Quad 2018-2019 (PF) 60 mcg (15 mcg x 4)/0.5 mL IM syringe  . insulin NPH-regular Human (NOVOLIN 70/30) (70-30) 100 UNIT/ML injection Novolin 70/30 U-100 Insulin  take 50u am/60u pm  . insulin NPH-regular Human (NOVOLIN 70/30) (70-30) 100 UNIT/ML injection Novolin 70/30 U-100 Insulin  take 50u am/60u pm (Patient not taking: Reported on 03/19/2020)  . irbesartan-hydrochlorothiazide (AVALIDE) 150-12.5 MG tablet  (Patient not taking: Reported on 03/19/2020)  . irbesartan-hydrochlorothiazide (AVALIDE) 300-12.5 MG tablet Take by mouth as directed.  Marland Kitchen ketoconazole (NIZORAL) 2 % cream ketoconazole 2 % topical cream  APPLY TO THE AFFECTED AREA(S) BY TOPICAL ROUTE ONCE DAILY - between toes  . NOVOLOG MIX 70/30 (70-30) 100 UNIT/ML injection   . POLYETHYLENE GLYCOL 3350-GRX PO polyethylene glycol 3350 17 gram oral powder packet   Current Facility-Administered Medications (Other)  Medication Route  . Bevacizumab (AVASTIN) SOLN 1.25 mg Intravitreal  . Bevacizumab (AVASTIN) SOLN 1.25 mg Intravitreal  . Bevacizumab (AVASTIN) SOLN 1.25 mg Intravitreal  . Bevacizumab (AVASTIN) SOLN 1.25 mg Intravitreal  . Bevacizumab (AVASTIN) SOLN 1.25 mg Intravitreal      REVIEW OF SYSTEMS: ROS    Positive for: Neurological, Endocrine, Eyes   Negative for: Constitutional, Gastrointestinal, Skin, Genitourinary, Musculoskeletal, HENT, Cardiovascular, Respiratory, Psychiatric, Allergic/Imm, Heme/Lymph   Last edited by Doneen Poisson on 09/17/2020  9:38 AM. (History)       ALLERGIES Allergies  Allergen  Reactions  . Other     PAST MEDICAL HISTORY Past Medical History:  Diagnosis Date  . Cataract    Combined form OD  . Diabetes mellitus   . Diabetic retinopathy ([place])    PDR OU  . Hypertension   . Hypertensive retinopathy    OU   Past Surgical History:  Procedure Laterality Date  . CATARACT EXTRACTION Left 05/2019   Dr. Clent Jacks  . EYE SURGERY Left    Cat Sx - Dr. Elliot Dally  . TUBAL LIGATION      FAMILY HISTORY Family History  Problem Relation Age of Onset  . Breast cancer Cousin 25  . Diabetes Cousin   . Diabetes Mother   . Diabetes Father   . Diabetes Sister   . Diabetes Brother   . Diabetes Maternal Aunt   . Diabetes Maternal Uncle   . Diabetes Paternal Aunt   . Diabetes Paternal Uncle     SOCIAL HISTORY Social History   Tobacco Use  . Smoking status: Never Smoker  . Smokeless tobacco: Never Used  Vaping Use  . Vaping Use: Never used  Substance Use Topics  . Alcohol use: No  . Drug use: No         OPHTHALMIC EXAM:  Base Eye Exam    Visual Acuity (Snellen - Linear)      Right Left   Dist [place] 20/40 -2 20/50 +2   Dist ph [place] NI NI       Tonometry (Tonopen, 9:45 AM)      Right Left   Pressure 21 15  Squeezing       Pupils      Dark Light Shape React APD   Right 3 2 Round Brisk 0   Left 3 2 Round Brisk 0       Visual Fields      Left Right    Full Full       Extraocular Movement      Right Left    Full Full       Neuro/Psych    Oriented x3: Yes   Mood/Affect: Normal       Dilation    Both eyes: 1.0% Mydriacyl, 2.5% Phenylephrine @ 9:45 AM        Slit Lamp and Fundus Exam    Slit Lamp Exam      Right Left   Lids/Lashes Dermatochalasis - upper lid Dermatochalasis - upper lid   Conjunctiva/Sclera Nasal and temporal Pinguecula, Melanosis Nasal and temporal Pinguecula, Melanosis   Cornea Arcus, trace Punctate epithelial erosions Arcus, well healed temporal cataract wounds, 1+ PEE   Anterior Chamber Deep and quiet, narrow angles Deep and quiet   Iris Round and dilated, no NVI Round and dilated, no NVI, focal atrophy at 0230   Lens 2-3+ Nuclear sclerosis, 3+ Cortical cataract PC IOL in good position with open PC   Vitreous Mild syneresis, Posterior vitreous detachment, old VH inferiorly Vitreous syneresis, mild residual old VH       Fundus Exam      Right Left   Disc Pink and Sharp, No NVD, focal PPP  temporally, Compact Pink and Sharp, mild residual fibrosis, no NVD, Compact   C/D Ratio 0.1 0.1   Macula Flat, good foveal reflex, Retinal pigment epithelial mottling, trace ERM, No heme or edema Flat, good foveal reflex, +edema--mild increase from prior, cluster of MA/IRH temporal macula   Vessels mild attenuation, mild Tortuous Vascular attenuation, mildly Tortuous   Periphery  Attached, 360 PRP scars  Attached, scattered DBH, good 360 PRP in place, residual fibrosis in the IN quad          IMAGING AND PROCEDURES  Imaging and Procedures for 02/11/18  OCT, Retina - OU - Both Eyes       Right Eye Quality was good. Central Foveal Thickness: 160. Progression has been stable. Findings include normal foveal contour, no SRF, epiretinal membrane, no IRF (Broad foveal contour).   Left Eye Quality was good. Central Foveal Thickness: 253. Progression has worsened. Findings include normal foveal contour, no SRF, intraretinal fluid (Persistent IRF--mild increase centrally).   Notes *Images captured and stored on drive  Diagnosis / Impression:  OD: NFP; no IRF/SRF Broad foveal contour OS: Persistent IRF--mild increase centrally  Clinical management:  See below  Abbreviations: NFP - Normal foveal profile. CME - cystoid macular edema. PED - pigment epithelial detachment. IRF - intraretinal fluid. SRF - subretinal fluid. EZ - ellipsoid zone. ERM - epiretinal membrane. ORA - outer retinal atrophy. ORT - outer retinal tubulation. SRHM - subretinal hyper-reflective material         Intravitreal Injection, Pharmacologic Agent - OS - Left Eye       Time Out 09/17/2020. 10:53 AM. Confirmed correct patient, procedure, site, and patient consented.   Anesthesia Topical anesthesia was used. Anesthetic medications included Lidocaine 2%, Proparacaine 0.5%.   Procedure Preparation included 5% betadine to ocular surface, eyelid speculum. A (32g) needle was used.   Injection:  1.25 mg  Bevacizumab (AVASTIN) 1.25mg /0.59mL SOLN   NDC: 41660-630-16, Lot: [id number], Expiration date: 09/30/2020   Route: Intravitreal, Site: Left Eye, Waste: 0.05 mL  Post-op Post injection exam found visual acuity of at least counting fingers. The patient tolerated the procedure well. There were no complications. The patient received written and verbal post procedure care education. Post injection medications were not given.                 ASSESSMENT/PLAN:    ICD-10-CM   1. Proliferative diabetic retinopathy of both eyes with macular edema associated with type 2 diabetes mellitus (HCC)  F57.3220 Intravitreal Injection, Pharmacologic Agent - OS - Left Eye    Bevacizumab (AVASTIN) SOLN 1.25 mg  2. Retinal edema  H35.81 OCT, Retina - OU - Both Eyes  3. Vitreous hemorrhage of right eye ([place])  H43.11   4. Vitreous hemorrhage, left eye ([place])  H43.12   5. Essential hypertension  I10   6. Hypertensive retinopathy of both eyes  H35.033   7. Combined forms of age-related cataract of both eyes  H25.813   8. Proliferative diabetic retinopathy of both eyes without macular edema associated with type 2 diabetes mellitus ([place])  U54.2706     1,2. Proliferative diabetic retinopathy OU  - delayed f/u today -- 4 mos instead of 4-6 wks  - pt was lost to f/u from Nov 2019 to Apr 2021  - s/p PRP OU  - s/p PRP fill-in OS (07.12.19), #2 (10.28.19)  - S/P PRP fill-in OD (09.23.19)  - history of bilateral vitreous heme -- now clear OU  - S/P IVA OD #1(05.10.19), #2 (06.11.19), #3 (10.14.19)  - s/p IVA OS #1 (09.13.19), #2 (10.14.19), #3 (04.30.21), #4 (08.06.21)  - FA on 07.12.19 shows active NV OS with leakage  - today, BCVA 20/40 OD, 20/50 OS  - OCT shows persistent IRF OS -- mild increase centrally  - recommend IVA OS #5 today, 12.10.21, for DME  - pt wishes to proceed with  injection  - RBA of procedure discussed, questions answered  - informed consent obtained  - Avastin informed consent form signed  and scanned on 04.30.2021  - see procedure note  - f/u 4-6 weeks -- DFE, OCT, possible injection  3. Vitreous Hemorrhage OD  - acute onset VH OD -- started ~02/07/18  - now resolved  - likely secondary to PDR (see above) -- retina attached with PRP 360  - S/P IVA #1 OD (05.10.19), #2 (06.11.19), #3 (10.14.19)  4. Vitreous hemorrhage OS  - onset diffuse hemorrhage -- started 9.11.19  - stably improved today  - s/p IVA OS #1 (09.13.19), #2 (10.14.19)  - s/p PRP fill-in OS (10.28.19)  5,6. Hypertensive retinopathy OU  - discussed importance of tight BP control and its role in current VH symptoms  - monitor  7. Combined form cataract OD  - The symptoms of cataract, surgical options, and treatments and risks were discussed with patient.  - discussed diagnosis and progression  - approaching visual significance  - under the expert care of [facility]  - clear from a retina standpoint to proceed with cataract surgery when pt and surgeon are ready  - pt to get it scheduled in 2022  8. Pseudophakia OS  - s/p CE/IOL OS (Dr. Elliot Dally, 08.20)  - s/p YAG cap (Dr. Elliot Dally)  - monitor  Ophthalmic Meds Ordered this visit:  Meds ordered this encounter  Medications  . Bevacizumab (AVASTIN) SOLN 1.25 mg      Return for 4-6 wk f/u for PDR OU w/DFE/OCT/likely inj..  There are no Patient Instructions on file for this visit.  Explained the diagnoses, plan, and follow up with the patient and they expressed understanding.  Patient expressed understanding of the importance of proper follow up care.   This document serves as a record of services personally performed by Gardiner Sleeper, MD, PhD. It was created on their behalf by San Jetty. Owens Shark, OA an ophthalmic technician. The creation of this record is the provider's dictation and/or activities during the visit.    Electronically signed by: San Jetty. Owens Shark, [place] 12.08.2021 12:20 PM  This document serves as a record of services personally  performed by Gardiner Sleeper, MD, PhD. It was created on their behalf by Estill Bakes, COT an ophthalmic technician. The creation of this record is the provider's dictation and/or activities during the visit.    Electronically signed by: Estill Bakes, COT 12.10.21 @ 12:20 PM  Gardiner Sleeper, M.D., Ph.D. Diseases & Surgery of the Retina and [place] 12.10.21  I have reviewed the above documentation for accuracy and completeness, and I agree with the above. Gardiner Sleeper, M.D., Ph.D. 09/17/20 12:20 PM   Abbreviations: M myopia (nearsighted); A astigmatism; H hyperopia (farsighted); P presbyopia; Mrx spectacle prescription;  CTL contact lenses; OD right eye; OS left eye; OU both eyes  XT exotropia; ET esotropia; PEK punctate epithelial keratitis; PEE punctate epithelial erosions; DES dry eye syndrome; MGD meibomian gland dysfunction; ATs artificial tears; PFAT's preservative free artificial tears; [place] nuclear sclerotic cataract; PSC posterior subcapsular cataract; ERM epi-retinal membrane; PVD posterior vitreous detachment; RD retinal detachment; DM diabetes mellitus; DR diabetic retinopathy; NPDR non-proliferative diabetic retinopathy; PDR proliferative diabetic retinopathy; CSME clinically significant macular edema; DME diabetic macular edema; dbh dot blot hemorrhages; CWS cotton wool spot; POAG primary open angle glaucoma; C/D cup-to-disc ratio; HVF humphrey visual field; GVF goldmann visual field; OCT optical coherence tomography; IOP intraocular  pressure; BRVO Branch retinal vein occlusion; CRVO central retinal vein occlusion; CRAO central retinal artery occlusion; BRAO branch retinal artery occlusion; RT retinal tear; SB scleral buckle; PPV pars plana vitrectomy; VH Vitreous hemorrhage; PRP panretinal laser photocoagulation; IVK intravitreal kenalog; VMT vitreomacular traction; MH Macular hole;  NVD neovascularization of the disc; NVE neovascularization  elsewhere; AREDS age related eye disease study; ARMD age related macular degeneration; POAG primary open angle glaucoma; EBMD epithelial/anterior basement membrane dystrophy; ACIOL anterior chamber intraocular lens; IOL intraocular lens; PCIOL posterior chamber intraocular lens; Phaco/IOL phacoemulsification with intraocular lens placement; [place] photorefractive keratectomy; LASIK laser assisted in situ keratomileusis; HTN hypertension; DM diabetes mellitus; COPD chronic obstructive pulmonary disease

## 2020-09-17 ENCOUNTER — Ambulatory Visit (INDEPENDENT_AMBULATORY_CARE_PROVIDER_SITE_OTHER): Payer: BC Managed Care – PPO | Admitting: Ophthalmology

## 2020-09-17 ENCOUNTER — Encounter (INDEPENDENT_AMBULATORY_CARE_PROVIDER_SITE_OTHER): Payer: Self-pay | Admitting: Ophthalmology

## 2020-09-17 ENCOUNTER — Other Ambulatory Visit: Payer: Self-pay

## 2020-09-17 DIAGNOSIS — H4312 Vitreous hemorrhage, left eye: Secondary | ICD-10-CM | POA: Diagnosis not present

## 2020-09-17 DIAGNOSIS — H3581 Retinal edema: Secondary | ICD-10-CM | POA: Diagnosis not present

## 2020-09-17 DIAGNOSIS — H25813 Combined forms of age-related cataract, bilateral: Secondary | ICD-10-CM

## 2020-09-17 DIAGNOSIS — I10 Essential (primary) hypertension: Secondary | ICD-10-CM

## 2020-09-17 DIAGNOSIS — E113593 Type 2 diabetes mellitus with proliferative diabetic retinopathy without macular edema, bilateral: Secondary | ICD-10-CM

## 2020-09-17 DIAGNOSIS — E113513 Type 2 diabetes mellitus with proliferative diabetic retinopathy with macular edema, bilateral: Secondary | ICD-10-CM

## 2020-09-17 DIAGNOSIS — H4311 Vitreous hemorrhage, right eye: Secondary | ICD-10-CM

## 2020-09-17 DIAGNOSIS — H35033 Hypertensive retinopathy, bilateral: Secondary | ICD-10-CM

## 2020-09-17 MED ORDER — BEVACIZUMAB CHEMO INJECTION 1.25MG/0.05ML SYRINGE FOR KALEIDOSCOPE
1.2500 mg | INTRAVITREAL | Status: AC | PRN
  Administered 2020-09-17: 1.25 mg via INTRAVITREAL

## 2020-10-22 DIAGNOSIS — I10 Essential (primary) hypertension: Secondary | ICD-10-CM | POA: Diagnosis not present

## 2020-10-22 DIAGNOSIS — E1165 Type 2 diabetes mellitus with hyperglycemia: Secondary | ICD-10-CM | POA: Diagnosis not present

## 2020-10-22 DIAGNOSIS — M1711 Unilateral primary osteoarthritis, right knee: Secondary | ICD-10-CM | POA: Diagnosis not present

## 2020-10-22 DIAGNOSIS — E1142 Type 2 diabetes mellitus with diabetic polyneuropathy: Secondary | ICD-10-CM | POA: Diagnosis not present

## 2020-10-29 ENCOUNTER — Encounter (INDEPENDENT_AMBULATORY_CARE_PROVIDER_SITE_OTHER): Payer: BC Managed Care – PPO | Admitting: Ophthalmology

## 2020-11-05 ENCOUNTER — Other Ambulatory Visit: Payer: Self-pay | Admitting: Internal Medicine

## 2020-11-05 DIAGNOSIS — Z1231 Encounter for screening mammogram for malignant neoplasm of breast: Secondary | ICD-10-CM

## 2020-11-08 ENCOUNTER — Ambulatory Visit
Admission: RE | Admit: 2020-11-08 | Discharge: 2020-11-08 | Disposition: A | Discharge status: Home / Self Care | Payer: BC Managed Care – PPO | Source: Ambulatory Visit | Attending: Internal Medicine | Admitting: Internal Medicine

## 2020-11-08 ENCOUNTER — Other Ambulatory Visit: Payer: Self-pay

## 2020-11-08 DIAGNOSIS — Z1231 Encounter for screening mammogram for malignant neoplasm of breast: Secondary | ICD-10-CM

## 2021-01-03 DIAGNOSIS — H25811 Combined forms of age-related cataract, right eye: Secondary | ICD-10-CM | POA: Diagnosis not present

## 2021-01-03 DIAGNOSIS — H40053 Ocular hypertension, bilateral: Secondary | ICD-10-CM | POA: Diagnosis not present

## 2021-01-03 DIAGNOSIS — E113312 Type 2 diabetes mellitus with moderate nonproliferative diabetic retinopathy with macular edema, left eye: Secondary | ICD-10-CM | POA: Diagnosis not present

## 2021-01-03 DIAGNOSIS — Z794 Long term (current) use of insulin: Secondary | ICD-10-CM | POA: Diagnosis not present

## 2021-01-31 DIAGNOSIS — E1165 Type 2 diabetes mellitus with hyperglycemia: Secondary | ICD-10-CM | POA: Diagnosis not present

## 2021-01-31 DIAGNOSIS — I10 Essential (primary) hypertension: Secondary | ICD-10-CM | POA: Diagnosis not present

## 2021-01-31 DIAGNOSIS — E782 Mixed hyperlipidemia: Secondary | ICD-10-CM | POA: Diagnosis not present

## 2021-02-04 DIAGNOSIS — I10 Essential (primary) hypertension: Secondary | ICD-10-CM | POA: Diagnosis not present

## 2021-02-04 DIAGNOSIS — E1165 Type 2 diabetes mellitus with hyperglycemia: Secondary | ICD-10-CM | POA: Diagnosis not present

## 2021-02-04 DIAGNOSIS — G4733 Obstructive sleep apnea (adult) (pediatric): Secondary | ICD-10-CM | POA: Diagnosis not present

## 2021-02-04 DIAGNOSIS — E782 Mixed hyperlipidemia: Secondary | ICD-10-CM | POA: Diagnosis not present

## 2021-03-11 DIAGNOSIS — H25811 Combined forms of age-related cataract, right eye: Secondary | ICD-10-CM | POA: Diagnosis not present

## 2021-04-02 IMAGING — MG DIGITAL SCREENING BILAT W/ CAD
6 series · 6 of 6 positions shown · non-contrast
Comparison: None.

ACR Breast Density Category a: The breast tissue is almost entirely
fatty.

CLINICAL DATA: Screening.

EXAM:
DIGITAL SCREENING BILATERAL MAMMOGRAM WITH CAD

[R MLO (1 of 2)]
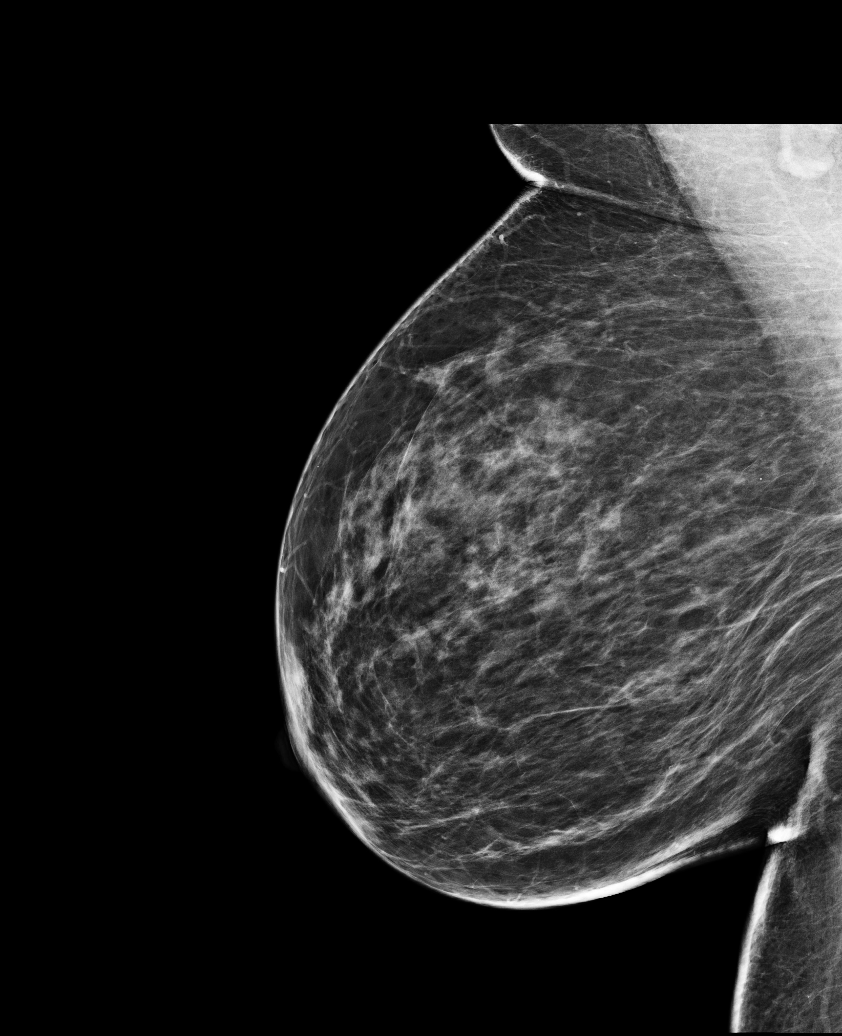

[R MLO (2 of 2)]
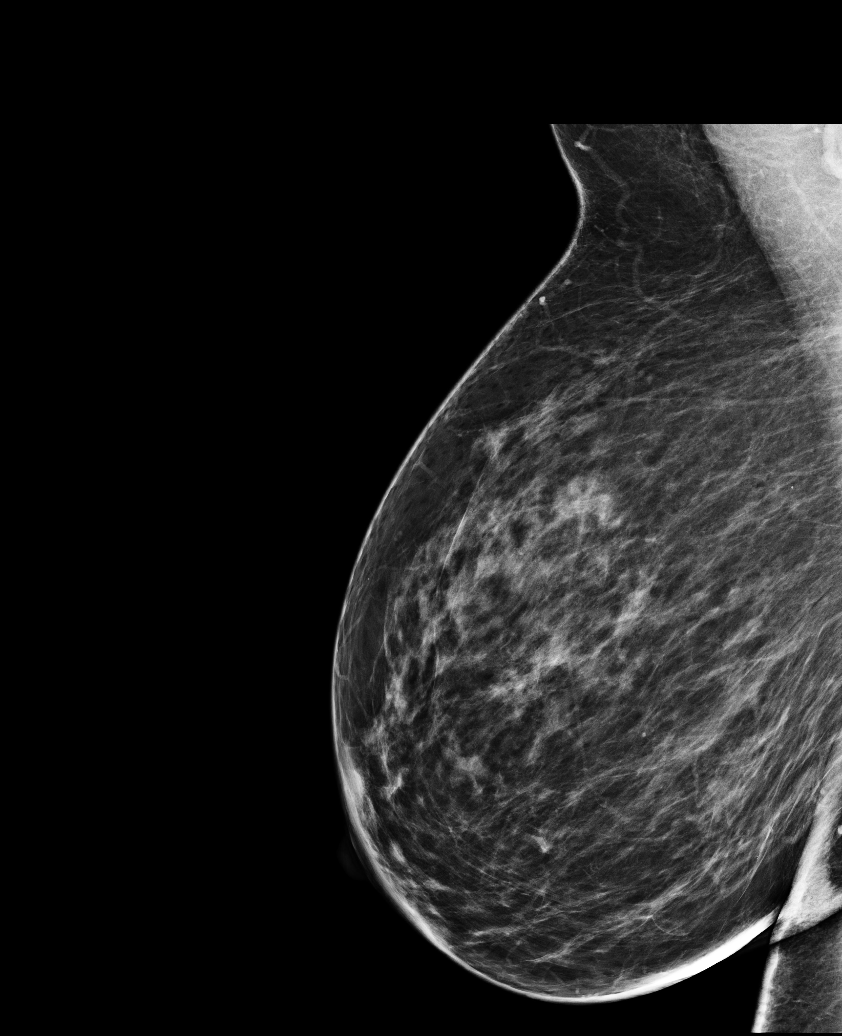

[R CC]
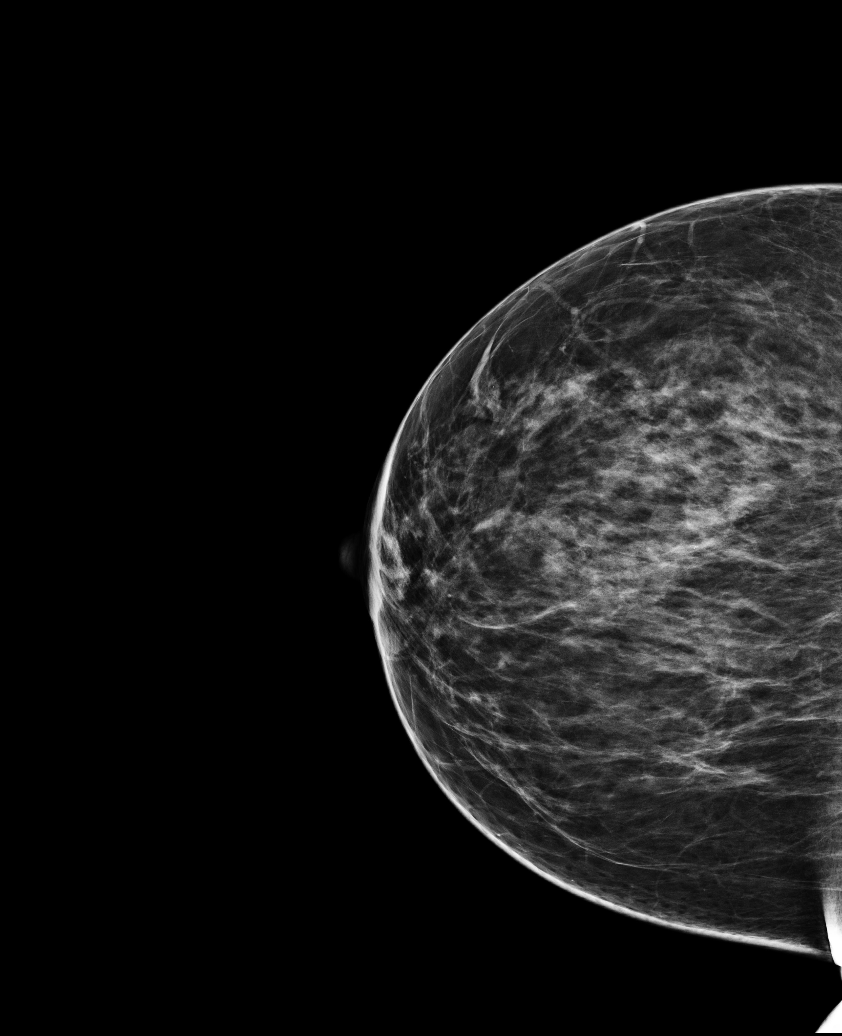

[L MLO (1 of 2)]
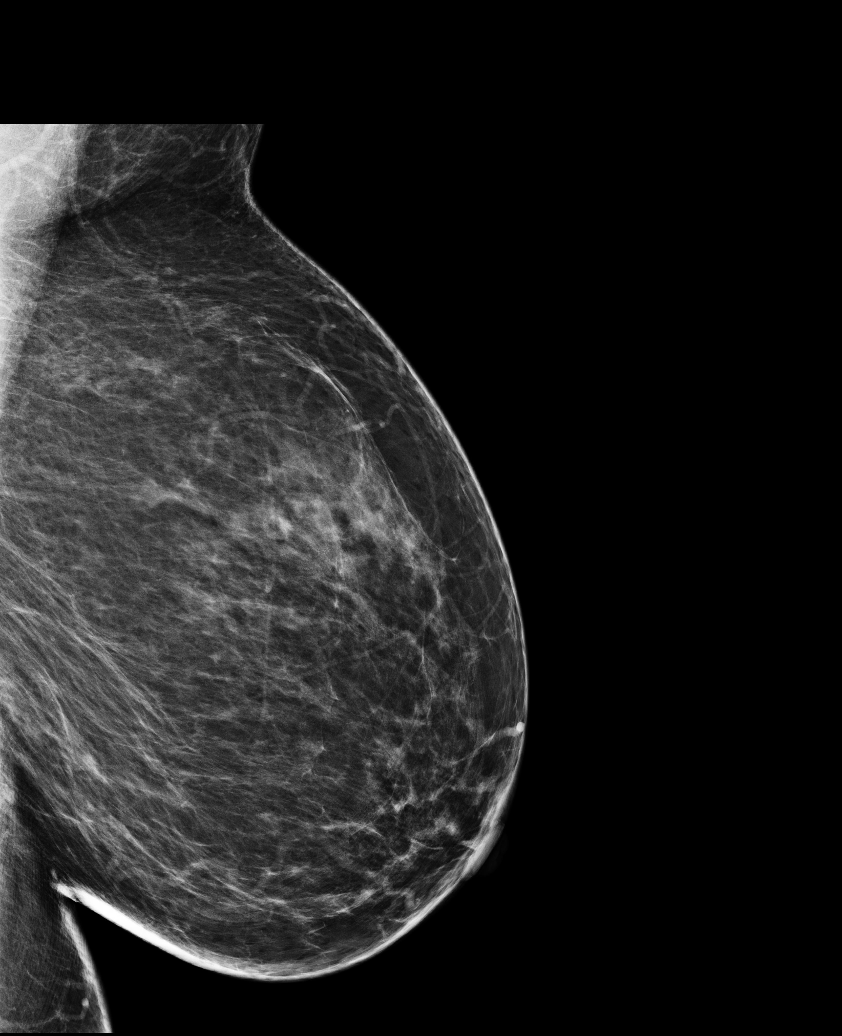

[L CC]
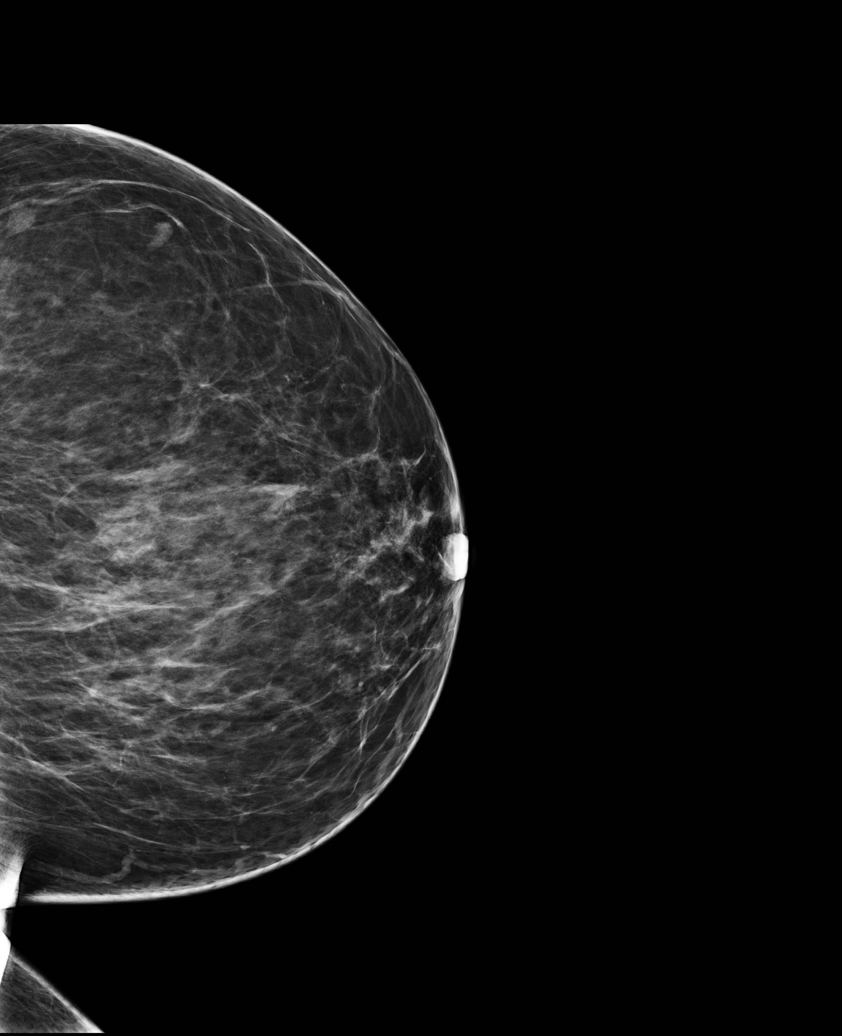

[L MLO (2 of 2)]
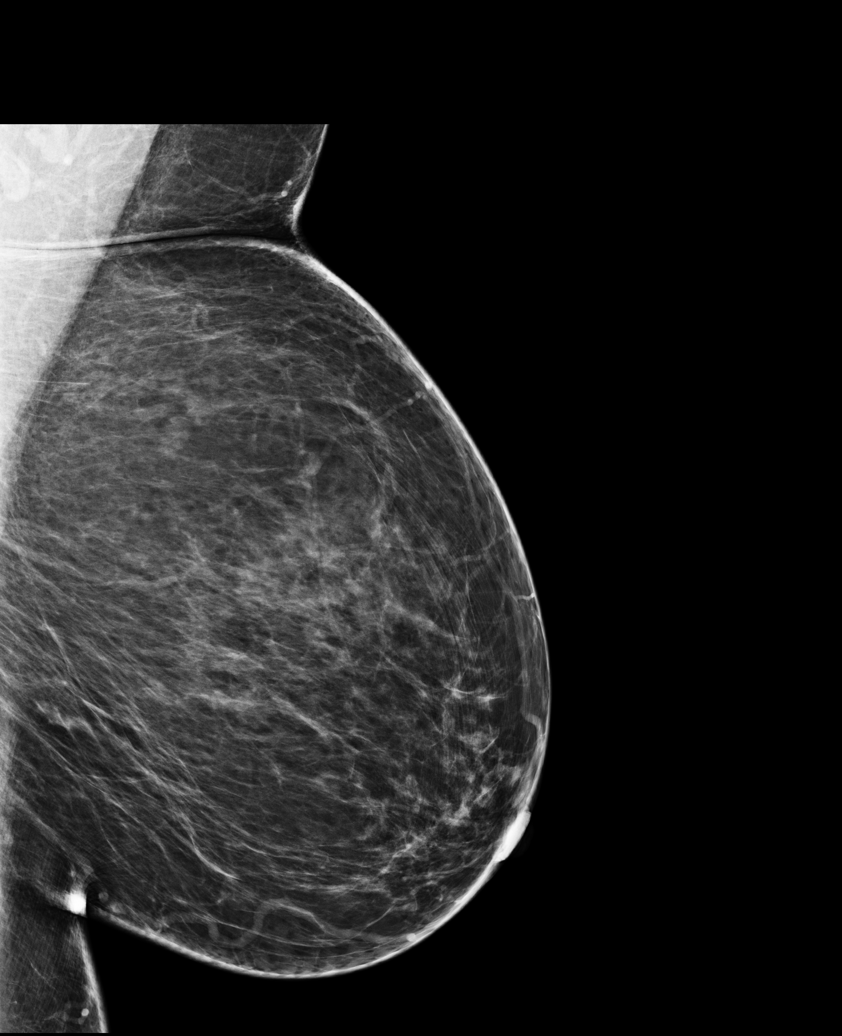

[6 of 6 positions shown; findings below may reference images not displayed]

FINDINGS: There are no findings suspicious for malignancy. Images were
processed with CAD.
IMPRESSION: No mammographic evidence of malignancy. A result letter of this
screening mammogram will be mailed directly to the patient.

RECOMMENDATION:
Screening mammogram in one year. (Code:[id number])

BI-RADS CATEGORY  1: Negative.

## 2021-05-20 DIAGNOSIS — I10 Essential (primary) hypertension: Secondary | ICD-10-CM | POA: Diagnosis not present

## 2021-05-20 DIAGNOSIS — E1142 Type 2 diabetes mellitus with diabetic polyneuropathy: Secondary | ICD-10-CM | POA: Diagnosis not present

## 2021-05-20 DIAGNOSIS — M1711 Unilateral primary osteoarthritis, right knee: Secondary | ICD-10-CM | POA: Diagnosis not present

## 2021-05-20 DIAGNOSIS — I739 Peripheral vascular disease, unspecified: Secondary | ICD-10-CM | POA: Diagnosis not present

## 2021-05-20 DIAGNOSIS — E1165 Type 2 diabetes mellitus with hyperglycemia: Secondary | ICD-10-CM | POA: Diagnosis not present

## 2021-05-20 DIAGNOSIS — G5603 Carpal tunnel syndrome, bilateral upper limbs: Secondary | ICD-10-CM | POA: Diagnosis not present

## 2021-09-23 DIAGNOSIS — Z6841 Body Mass Index (BMI) 40.0 and over, adult: Secondary | ICD-10-CM | POA: Diagnosis not present

## 2021-09-23 DIAGNOSIS — Z1211 Encounter for screening for malignant neoplasm of colon: Secondary | ICD-10-CM | POA: Diagnosis not present

## 2021-09-23 DIAGNOSIS — Z0001 Encounter for general adult medical examination with abnormal findings: Secondary | ICD-10-CM | POA: Diagnosis not present

## 2021-09-23 DIAGNOSIS — Z124 Encounter for screening for malignant neoplasm of cervix: Secondary | ICD-10-CM | POA: Diagnosis not present

## 2021-09-23 DIAGNOSIS — Z1239 Encounter for other screening for malignant neoplasm of breast: Secondary | ICD-10-CM | POA: Diagnosis not present

## 2021-09-23 DIAGNOSIS — E1142 Type 2 diabetes mellitus with diabetic polyneuropathy: Secondary | ICD-10-CM | POA: Diagnosis not present

## 2021-09-23 DIAGNOSIS — E1165 Type 2 diabetes mellitus with hyperglycemia: Secondary | ICD-10-CM | POA: Diagnosis not present

## 2021-09-26 DIAGNOSIS — Z0001 Encounter for general adult medical examination with abnormal findings: Secondary | ICD-10-CM | POA: Diagnosis not present

## 2021-09-26 DIAGNOSIS — E785 Hyperlipidemia, unspecified: Secondary | ICD-10-CM | POA: Diagnosis not present

## 2021-09-26 DIAGNOSIS — E1165 Type 2 diabetes mellitus with hyperglycemia: Secondary | ICD-10-CM | POA: Diagnosis not present

## 2021-10-11 ENCOUNTER — Other Ambulatory Visit: Payer: Self-pay | Admitting: Internal Medicine

## 2021-10-11 DIAGNOSIS — Z1231 Encounter for screening mammogram for malignant neoplasm of breast: Secondary | ICD-10-CM

## 2021-11-11 ENCOUNTER — Ambulatory Visit: Payer: BC Managed Care – PPO

## 2021-11-18 ENCOUNTER — Ambulatory Visit
Admission: RE | Admit: 2021-11-18 | Discharge: 2021-11-18 | Disposition: A | Discharge status: Home / Self Care | Payer: BC Managed Care – PPO | Source: Ambulatory Visit | Attending: Internal Medicine | Admitting: Internal Medicine

## 2021-11-18 DIAGNOSIS — Z1231 Encounter for screening mammogram for malignant neoplasm of breast: Secondary | ICD-10-CM | POA: Diagnosis not present

## 2021-12-09 DIAGNOSIS — E785 Hyperlipidemia, unspecified: Secondary | ICD-10-CM | POA: Diagnosis not present

## 2021-12-09 DIAGNOSIS — E1121 Type 2 diabetes mellitus with diabetic nephropathy: Secondary | ICD-10-CM | POA: Diagnosis not present

## 2021-12-09 DIAGNOSIS — I10 Essential (primary) hypertension: Secondary | ICD-10-CM | POA: Diagnosis not present

## 2021-12-09 DIAGNOSIS — E1165 Type 2 diabetes mellitus with hyperglycemia: Secondary | ICD-10-CM | POA: Diagnosis not present

## 2022-02-13 DIAGNOSIS — E1121 Type 2 diabetes mellitus with diabetic nephropathy: Secondary | ICD-10-CM | POA: Diagnosis not present

## 2022-02-13 DIAGNOSIS — E782 Mixed hyperlipidemia: Secondary | ICD-10-CM | POA: Diagnosis not present

## 2022-02-13 DIAGNOSIS — E1165 Type 2 diabetes mellitus with hyperglycemia: Secondary | ICD-10-CM | POA: Diagnosis not present

## 2022-03-03 DIAGNOSIS — Z0001 Encounter for general adult medical examination with abnormal findings: Secondary | ICD-10-CM | POA: Diagnosis not present

## 2022-03-10 DIAGNOSIS — I10 Essential (primary) hypertension: Secondary | ICD-10-CM | POA: Diagnosis not present

## 2022-03-10 DIAGNOSIS — E1121 Type 2 diabetes mellitus with diabetic nephropathy: Secondary | ICD-10-CM | POA: Diagnosis not present

## 2022-03-10 DIAGNOSIS — Z0001 Encounter for general adult medical examination with abnormal findings: Secondary | ICD-10-CM | POA: Diagnosis not present

## 2022-03-10 DIAGNOSIS — I739 Peripheral vascular disease, unspecified: Secondary | ICD-10-CM | POA: Diagnosis not present

## 2022-03-10 DIAGNOSIS — E1165 Type 2 diabetes mellitus with hyperglycemia: Secondary | ICD-10-CM | POA: Diagnosis not present

## 2022-03-10 DIAGNOSIS — R42 Dizziness and giddiness: Secondary | ICD-10-CM | POA: Diagnosis not present

## 2022-03-10 DIAGNOSIS — Z124 Encounter for screening for malignant neoplasm of cervix: Secondary | ICD-10-CM | POA: Diagnosis not present

## 2022-03-10 DIAGNOSIS — Z1211 Encounter for screening for malignant neoplasm of colon: Secondary | ICD-10-CM | POA: Diagnosis not present

## 2022-03-10 DIAGNOSIS — E1142 Type 2 diabetes mellitus with diabetic polyneuropathy: Secondary | ICD-10-CM | POA: Diagnosis not present

## 2022-03-10 DIAGNOSIS — Z23 Encounter for immunization: Secondary | ICD-10-CM | POA: Diagnosis not present

## 2022-03-29 ENCOUNTER — Ambulatory Visit: Payer: BC Managed Care – PPO | Admitting: Obstetrics

## 2022-05-17 ENCOUNTER — Ambulatory Visit (INDEPENDENT_AMBULATORY_CARE_PROVIDER_SITE_OTHER): Payer: BC Managed Care – PPO | Admitting: Obstetrics and Gynecology

## 2022-05-17 ENCOUNTER — Encounter: Payer: Self-pay | Admitting: Obstetrics and Gynecology

## 2022-05-17 ENCOUNTER — Other Ambulatory Visit (HOSPITAL_COMMUNITY)
Admission: RE | Admit: 2022-05-17 | Discharge: 2022-05-17 | Disposition: A | Discharge status: Home / Self Care | Payer: BC Managed Care – PPO | Source: Ambulatory Visit | Attending: Obstetrics | Admitting: Obstetrics

## 2022-05-17 DIAGNOSIS — Z01419 Encounter for gynecological examination (general) (routine) without abnormal findings: Secondary | ICD-10-CM | POA: Insufficient documentation

## 2022-05-17 NOTE — Progress Notes (Signed)
Jenny Cook is a 61 y.o. [phone] female here for a routine annual gynecologic exam.  Current complaints: None.   Denies abnormal vaginal bleeding, discharge, pelvic pain, problems with intercourse or other gynecologic concerns.    Gynecologic History Patient's last menstrual period was 08/23/2013. Contraception: post menopausal status Last Pap: 2019. Results were: normal Last mammogram: 11/2021. Results were: normal  Obstetric History OB History  Gravida Para Term Preterm AB Living  '2 2 2     2  '$ SAB IAB Ectopic Multiple Live Births          2    # Outcome Date GA Lbr Len/2nd Weight Sex Delivery Anes PTL Lv  2 Term 07/27/98     Vag-Spont   LIV  1 Term 02/15/91     Vag-Spont   LIV    Past Medical History:  Diagnosis Date   Cataract    Combined form OD   Diabetes mellitus    Diabetic retinopathy ([place])    PDR OU   Hypertension    Hypertensive retinopathy    OU    Past Surgical History:  Procedure Laterality Date   CATARACT EXTRACTION Left 05/2019   Dr. Clent Jacks   EYE SURGERY Left    Cat Sx - Dr. Elliot Dally   TUBAL LIGATION      Current Outpatient Medications on File Prior to Visit  Medication Sig Dispense Refill   amLODipine (NORVASC) 10 MG tablet Take 10 mg by mouth every morning.     atorvastatin (LIPITOR) 20 MG tablet      hydrOXYzine (VISTARIL) 25 MG capsule hydroxyzine pamoate 25 mg capsule     insulin NPH-regular Human (NOVOLIN 70/30) (70-30) 100 UNIT/ML injection Novolin 70/30 U-100 Insulin  take 50u am/60u pm     irbesartan-hydrochlorothiazide (AVALIDE) 300-12.5 MG tablet Take by mouth as directed.     latanoprost (XALATAN) 0.005 % ophthalmic solution Place 1 drop into both eyes at bedtime.     acetaminophen (TYLENOL) 325 MG tablet Take 650 mg by mouth every 6 (six) hours as needed. (Patient not taking: Reported on 05/17/2022)     Dulaglutide (TRULICITY) 3 MA/2.6JF SOPN Trulicity 3 HL/4.5 mL subcutaneous pen injector (Patient not taking: Reported on  05/17/2022)     Influenza vac split quadrivalent PF (FLUARIX QUADRIVALENT) 0.5 ML injection Fluarix Quad 2018-2019 (PF) 60 mcg (15 mcg x 4)/0.5 mL IM syringe (Patient not taking: Reported on 05/17/2022)     insulin NPH-regular Human (NOVOLIN 70/30) (70-30) 100 UNIT/ML injection Novolin 70/30 U-100 Insulin  take 50u am/60u pm (Patient not taking: No sig reported)     irbesartan-hydrochlorothiazide (AVALIDE) 150-12.5 MG tablet  (Patient not taking: Reported on 05/17/2022)     ketoconazole (NIZORAL) 2 % cream ketoconazole 2 % topical cream  APPLY TO THE AFFECTED AREA(S) BY TOPICAL ROUTE ONCE DAILY - between toes (Patient not taking: Reported on 05/17/2022)     Latanoprost 0.005 % EMUL latanoprost 0.005 % eye drops (Patient not taking: No sig reported)     LATANOPROST OP latanoprost 0.005 % eye drops (Patient not taking: No sig reported)     NOVOLOG MIX 70/30 (70-30) 100 UNIT/ML injection  (Patient not taking: Reported on 05/17/2022)     POLYETHYLENE GLYCOL 3350-GRX PO polyethylene glycol 3350 17 gram oral powder packet (Patient not taking: Reported on 05/17/2022)     Current Facility-Administered Medications on File Prior to Visit  Medication Dose Route Frequency Provider Last Rate Last Admin   Bevacizumab (AVASTIN) SOLN 1.25 mg  1.25 mg Intravitreal  Bernarda Caffey, MD   1.25 mg at 02/18/18 3500   Bevacizumab (AVASTIN) SOLN 1.25 mg  1.25 mg Intravitreal  Bernarda Caffey, MD   1.25 mg at 03/20/18 2245   Bevacizumab (AVASTIN) SOLN 1.25 mg  1.25 mg Intravitreal  Bernarda Caffey, MD   1.25 mg at 06/21/18 1213   Bevacizumab (AVASTIN) SOLN 1.25 mg  1.25 mg Intravitreal  Bernarda Caffey, MD   1.25 mg at 07/22/18 2306   Bevacizumab (AVASTIN) SOLN 1.25 mg  1.25 mg Intravitreal  Bernarda Caffey, MD   1.25 mg at 07/22/18 2306    Allergies  Allergen Reactions   Other     Social History   Socioeconomic History   Marital status: Single    Spouse name: Not on file   Number of children: Not on file   Years of education:  Not on file   Highest education level: Not on file  Occupational History   Not on file  Tobacco Use   Smoking status: Never   Smokeless tobacco: Never  Vaping Use   Vaping Use: Never used  Substance and Sexual Activity   Alcohol use: No   Drug use: No   Sexual activity: Not Currently    Birth control/protection: Surgical  Other Topics Concern   Not on file  Social History Narrative   Not on file   Social Determinants of Health   Financial Resource Strain: Not on file  Food Insecurity: Not on file  Transportation Needs: Not on file  Physical Activity: Not on file  Stress: Not on file  Social Connections: Not on file  Intimate Partner Violence: Not on file    Family History  Problem Relation Age of Onset   Breast cancer Cousin 25   Diabetes Cousin    Diabetes Mother    Diabetes Father    Diabetes Sister    Diabetes Brother    Diabetes Maternal Aunt    Diabetes Maternal Uncle    Diabetes Paternal Aunt    Diabetes Paternal Uncle     The following portions of the patient's history were reviewed and updated as appropriate: allergies, current medications, past family history, past medical history, past social history, past surgical history and problem list.  Review of Systems Pertinent items noted in HPI and remainder of comprehensive ROS otherwise negative.   Objective:  BP 112/79   Pulse 74   Ht '5\' 8"'$  (1.727 m)   Wt 120.9 kg   LMP 08/23/2013   BMI 40.54 kg/m  CONSTITUTIONAL: Well-developed, well-nourished female in no acute distress.  HENT:  Normocephalic, atraumatic, External right and left ear normal. Oropharynx is clear and moist EYES: Conjunctivae and EOM are normal. Pupils are equal, round, and reactive to light. No scleral icterus.  NECK: Normal range of motion, supple, no masses.  Normal thyroid.  SKIN: Skin is warm and dry. No rash noted. Not diaphoretic. No erythema. No pallor. [employer]: Alert and oriented to person, place, and time. Normal reflexes,  muscle tone coordination. No cranial nerve deficit noted. PSYCHIATRIC: Normal mood and affect. Normal behavior. Normal judgment and thought content. CARDIOVASCULAR: Normal heart rate noted, regular rhythm RESPIRATORY: Clear to auscultation bilaterally. Effort and breath sounds normal, no problems with respiration noted. BREASTS: Symmetric in size. No masses, skin changes, nipple drainage, or lymphadenopathy. ABDOMEN: Soft, normal bowel sounds, no distention noted.  No tenderness, rebound or guarding.  PELVIC: Normal appearing external genitalia; normal appearing vaginal mucosa and cervix.  No abnormal discharge noted.  Pap smear obtained.  Normal uterine size, no other palpable masses, no uterine or adnexal tenderness. Exam limited by pt habitus MUSCULOSKELETAL: Normal range of motion. No tenderness.  No cyanosis, clubbing, or edema.  2+ distal pulses.   Assessment:  Annual gynecologic examination with pap smear   Plan:  Will follow up results of pap smear and manage accordingly. Mammogram UTD Routine preventative health maintenance measures emphasized. Please refer to After Visit Summary for other counseling recommendations.    Chancy Milroy, MD, [place] Attending [place] for [facility], [place]

## 2022-05-17 NOTE — Patient Instructions (Signed)

## 2022-05-17 NOTE — Progress Notes (Signed)
Pt presents for annual and pap. Declined STD testing Normal mammogram screening 11/18/21 Colonoscopy due next year per pt

## 2022-05-19 LAB — CYTOLOGY - PAP
Adequacy: ABSENT
Comment: NEGATIVE
Diagnosis: NEGATIVE
High risk HPV: NEGATIVE

## 2022-05-22 ENCOUNTER — Telehealth: Payer: Self-pay

## 2022-05-22 NOTE — Telephone Encounter (Signed)
-----   Message from Chancy Milroy, MD sent at 05/20/2022 11:32 PM EDT ----- Please let pt know that her pap smear was normal.  Thanks Legrand Como

## 2022-05-22 NOTE — Telephone Encounter (Signed)
I connected with  Jenny Cook on 05/22/22 by telephone and verified that I am speaking with the correct person using two identifiers.    Pt informed of pap smear results (Normal) and has no questions at this time.

## 2022-07-13 DIAGNOSIS — H40053 Ocular hypertension, bilateral: Secondary | ICD-10-CM | POA: Diagnosis not present

## 2022-07-13 DIAGNOSIS — H35373 Puckering of macula, bilateral: Secondary | ICD-10-CM | POA: Diagnosis not present

## 2022-07-13 DIAGNOSIS — E113393 Type 2 diabetes mellitus with moderate nonproliferative diabetic retinopathy without macular edema, bilateral: Secondary | ICD-10-CM | POA: Diagnosis not present

## 2022-07-13 DIAGNOSIS — H26492 Other secondary cataract, left eye: Secondary | ICD-10-CM | POA: Diagnosis not present

## 2022-07-14 DIAGNOSIS — Z23 Encounter for immunization: Secondary | ICD-10-CM | POA: Diagnosis not present

## 2022-07-14 DIAGNOSIS — I10 Essential (primary) hypertension: Secondary | ICD-10-CM | POA: Diagnosis not present

## 2022-07-14 DIAGNOSIS — E1142 Type 2 diabetes mellitus with diabetic polyneuropathy: Secondary | ICD-10-CM | POA: Diagnosis not present

## 2022-07-14 DIAGNOSIS — E1165 Type 2 diabetes mellitus with hyperglycemia: Secondary | ICD-10-CM | POA: Diagnosis not present

## 2022-10-27 ENCOUNTER — Other Ambulatory Visit: Payer: Self-pay | Admitting: Internal Medicine

## 2022-10-27 DIAGNOSIS — Z1231 Encounter for screening mammogram for malignant neoplasm of breast: Secondary | ICD-10-CM

## 2022-11-03 DIAGNOSIS — G4733 Obstructive sleep apnea (adult) (pediatric): Secondary | ICD-10-CM | POA: Diagnosis not present

## 2022-11-03 DIAGNOSIS — E1142 Type 2 diabetes mellitus with diabetic polyneuropathy: Secondary | ICD-10-CM | POA: Diagnosis not present

## 2022-11-03 DIAGNOSIS — E1165 Type 2 diabetes mellitus with hyperglycemia: Secondary | ICD-10-CM | POA: Diagnosis not present

## 2022-11-03 DIAGNOSIS — E113599 Type 2 diabetes mellitus with proliferative diabetic retinopathy without macular edema, unspecified eye: Secondary | ICD-10-CM | POA: Diagnosis not present

## 2022-11-16 NOTE — Progress Notes (Signed)
[facility] Note  11/17/2022     CHIEF COMPLAINT Patient presents for Retina Follow Up   HISTORY OF PRESENT ILLNESS: Jenny Cook is a 62 y.o. female who presents to the clinic today for:   HPI     Retina Follow Up   In both eyes.  This started 6 weeks ago.  Duration of 6 weeks.  Since onset it is stable.  I, the attending physician,  performed the HPI with the patient and updated documentation appropriately.        Comments   4 week retina follow up PRP OU pt is reporting no vision change noticed she denies any flashes or floaters she has been having some watering and irritation in the right eye she has not been using any gtts her last blood sugar 152 this morning last A1C 8.17 October 2022      Last edited by Bernarda Caffey, MD on 11/17/2022 12:30 PM.    Pt states she had cataract sx OU with Dr. Katy Fitch, she states right lower eylid eye is puffy. Pt sleeps on right side   Referring physician: Audley Hose, MD [place],  [place] [address]  HISTORICAL INFORMATION:   Selected notes from the MEDICAL RECORD NUMBER Referred from Dr. Elliot Dally for concern of VH OU;   CURRENT MEDICATIONS: Current Outpatient Medications (Ophthalmic Drugs)  Medication Sig   latanoprost (XALATAN) 0.005 % ophthalmic solution Place 1 drop into both eyes at bedtime.   Latanoprost 0.005 % EMUL latanoprost 0.005 % eye drops (Patient not taking: No sig reported)   LATANOPROST OP latanoprost 0.005 % eye drops (Patient not taking: No sig reported)   No current facility-administered medications for this visit. (Ophthalmic Drugs)   Current Outpatient Medications (Other)  Medication Sig   acetaminophen (TYLENOL) 325 MG tablet Take 650 mg by mouth every 6 (six) hours as needed. (Patient not taking: Reported on 05/17/2022)   amLODipine (NORVASC) 10 MG tablet Take 10 mg by mouth every morning.   atorvastatin (LIPITOR) 20 MG tablet    Dulaglutide  (TRULICITY) 3 0000000 SOPN Trulicity 3 99991111 mL subcutaneous pen injector (Patient not taking: Reported on 05/17/2022)   hydrOXYzine (VISTARIL) 25 MG capsule hydroxyzine pamoate 25 mg capsule   Influenza vac split quadrivalent PF (FLUARIX QUADRIVALENT) 0.5 ML injection Fluarix Quad 2018-2019 (PF) 60 mcg (15 mcg x 4)/0.5 mL IM syringe (Patient not taking: Reported on 05/17/2022)   insulin NPH-regular Human (NOVOLIN 70/30) (70-30) 100 UNIT/ML injection Novolin 70/30 U-100 Insulin  take 50u am/60u pm   insulin NPH-regular Human (NOVOLIN 70/30) (70-30) 100 UNIT/ML injection Novolin 70/30 U-100 Insulin  take 50u am/60u pm (Patient not taking: No sig reported)   irbesartan-hydrochlorothiazide (AVALIDE) 150-12.5 MG tablet  (Patient not taking: Reported on 05/17/2022)   irbesartan-hydrochlorothiazide (AVALIDE) 300-12.5 MG tablet Take by mouth as directed.   ketoconazole (NIZORAL) 2 % cream ketoconazole 2 % topical cream  APPLY TO THE AFFECTED AREA(S) BY TOPICAL ROUTE ONCE DAILY - between toes (Patient not taking: Reported on 05/17/2022)   NOVOLOG MIX 70/30 (70-30) 100 UNIT/ML injection  (Patient not taking: Reported on 05/17/2022)   POLYETHYLENE GLYCOL 3350-GRX PO polyethylene glycol 3350 17 gram oral powder packet (Patient not taking: Reported on 05/17/2022)   Current Facility-Administered Medications (Other)  Medication Route   Bevacizumab (AVASTIN) SOLN 1.25 mg Intravitreal   Bevacizumab (AVASTIN) SOLN 1.25 mg Intravitreal   Bevacizumab (AVASTIN) SOLN 1.25 mg Intravitreal   Bevacizumab (  AVASTIN) SOLN 1.25 mg Intravitreal   Bevacizumab (AVASTIN) SOLN 1.25 mg Intravitreal   REVIEW OF SYSTEMS: ROS   Positive for: Neurological, Endocrine, Eyes Negative for: Constitutional, Gastrointestinal, Skin, Genitourinary, Musculoskeletal, HENT, Cardiovascular, Respiratory, Psychiatric, Allergic/Imm, Heme/Lymph Last edited by Parthenia Ames, COT on 11/17/2022  9:25 AM.     ALLERGIES Allergies  Allergen  Reactions   Other    PAST MEDICAL HISTORY Past Medical History:  Diagnosis Date   Cataract    Combined form OD   Diabetes mellitus    Diabetic retinopathy ([place])    PDR OU   Hypertension    Hypertensive retinopathy    OU   Past Surgical History:  Procedure Laterality Date   CATARACT EXTRACTION Left 05/2019   Dr. Clent Jacks   EYE SURGERY Left    Cat Sx - Dr. Elliot Dally   TUBAL LIGATION     FAMILY HISTORY Family History  Problem Relation Age of Onset   Breast cancer Cousin 25   Diabetes Cousin    Diabetes Mother    Diabetes Father    Diabetes Sister    Diabetes Brother    Diabetes Maternal Aunt    Diabetes Maternal Uncle    Diabetes Paternal Aunt    Diabetes Paternal Uncle    SOCIAL HISTORY Social History   Tobacco Use   Smoking status: Never   Smokeless tobacco: Never  Vaping Use   Vaping Use: Never used  Substance Use Topics   Alcohol use: No   Drug use: No       OPHTHALMIC EXAM:  Base Eye Exam     Visual Acuity (Snellen - Linear)       Right Left   Dist [place] 20/40 20/40 -2   Dist ph  NI NI         Tonometry (Tonopen, 9:29 AM)       Right Left   Pressure 19 16         Pupils       Pupils Dark Light Shape React APD   Right PERRL 3 2 Round Brisk None   Left PERRL 3 2 Round Brisk None         Visual Fields       Left Right    Full Full         Extraocular Movement       Right Left    Full, Ortho Full, Ortho         Neuro/Psych     Oriented x3: Yes   Mood/Affect: Normal         Dilation     Both eyes:            Slit Lamp and Fundus Exam     Slit Lamp Exam       Right Left   Lids/Lashes Dermatochalasis - upper lid Dermatochalasis - upper lid   Conjunctiva/Sclera Nasal and temporal Pinguecula, Melanosis Nasal and temporal Pinguecula, Melanosis   Cornea Arcus, 1+ Punctate epithelial erosions, well healed cataract wound Arcus, well healed temporal cataract wounds, 1+ PEE   Anterior Chamber Deep and  quiet, narrow angles Deep and quiet   Iris Round and dilated, no NVI Round and dilated, no NVI, focal atrophy at 0230   Lens PC IOL in good position PC IOL in good position with open PC   Anterior Vitreous Mild syneresis, Posterior vitreous detachment, old VH inferiorly Vitreous syneresis, mild residual old VH         Fundus  Exam       Right Left   Disc Pink and Sharp, No NVD, focal PPP temporally, Compact Pink and Sharp, mild residual fibrosis, no NVD, Compact   C/D Ratio 0.1 0.1   Macula Flat, good foveal reflex, Retinal pigment epithelial mottling, trace ERM, rare MA, no edema Flat, good foveal reflex, scattered MA, no frank edema   Vessels attenuated, mild tortuosity Vascular attenuation, mildly Tortuous   Periphery Attached, 360 PRP scars  Attached, scattered DBH, good 360 PRP in place, scattered fibrosis greatest IN quad           IMAGING AND PROCEDURES  Imaging and Procedures for 02/11/18  OCT, Retina - OU - Both Eyes       Right Eye Quality was good. Central Foveal Thickness: 152. Progression has been stable. Findings include normal foveal contour, no IRF, no SRF, epiretinal membrane (Broad foveal contour).   Left Eye Quality was good. Central Foveal Thickness: 177. Progression has improved. Findings include normal foveal contour, no IRF, no SRF, intraretinal hyper-reflective material, epiretinal membrane (Interval improvement in IRF temporal fovea and macula, just trace cystic changes remain, broad foveal contour).   Notes *Images captured and stored on drive  Diagnosis / Impression:  OD: NFP; no IRF/SRF; Broad foveal contour OS: Interval improvement in IRF temporal fovea and macula--just trace cystic changes remain; broad foveal contour  Clinical management:  See below  Abbreviations: NFP - Normal foveal profile. CME - cystoid macular edema. PED - pigment epithelial detachment. IRF - intraretinal fluid. SRF - subretinal fluid. EZ - ellipsoid zone. ERM -  epiretinal membrane. ORA - outer retinal atrophy. ORT - outer retinal tubulation. SRHM - subretinal hyper-reflective material             ASSESSMENT/PLAN:    ICD-10-CM   1. Proliferative diabetic retinopathy of both eyes with macular edema associated with type 2 diabetes mellitus ([place])  E11.3513 OCT, Retina - OU - Both Eyes    2. Vitreous hemorrhage of right eye ([place])  H43.11     3. Vitreous hemorrhage, left eye (H[facility])  H43.12     4. Essential hypertension  I10     5. Hypertensive retinopathy of both eyes  H35.033     6. Pseudophakia, both eyes  Z96.1      1. Proliferative diabetic retinopathy OU  - pt has been lost to f/u from Dec. 2021 to February 2024 (2y 3mo  - pt was lost to f/u from Nov 2019 to Apr 2021  - s/p PRP OU  - s/p PRP fill-in OS (07.12.19), #2 (10.28.19)  - S/P PRP fill-in OD (09.23.19)  - history of bilateral vitreous heme -- now clear OU  - S/P IVA OD #1 (05.10.19), #2 (06.11.19), #3 (10.14.19)  - s/p IVA OS #1 (09.13.19), #2 (10.14.19), #3 (04.30.21), #4 (08.06.21) #5 (12.10.21)  - FA on 07.12.19 shows active NV OS with leakage  - today, BCVA 20/40 OU  - OCT OD: NFP; no IRF/SRF; Broad foveal contour; OS: Interval improvement in IRF temporal fovea and macula--just trace cystic changes remain; broad foveal contour  - no treatment recommended today  - f/u 3-4 months -- DFE, OCT, possible injection  2. Vitreous Hemorrhage OD  - acute onset VH OD -- started ~02/07/18  - now resolved  - likely secondary to PDR (see above) -- retina attached with PRP 360  - S/P IVA #1 OD (05.10.19), #2 (06.11.19), #3 (10.14.19)  3. Vitreous hemorrhage OS  - onset diffuse hemorrhage -- started  9.11.19  - stably improved today  - s/p IVA OS #1 (09.13.19), #2 (10.14.19)  - s/p PRP fill-in OS (10.28.19)  4,5. Hypertensive retinopathy OU  - discussed importance of tight BP control and its role in current VH symptoms  - monitor  6. Pseudophakia OU  - s/p CE/IOL OU (OD: Dr.  Elliot Dally, 08.20, OS: ?Dr. Zenia Resides, 2021)  - s/p YAG cap (Dr. Elliot Dally)  - monitor  Ophthalmic Meds Ordered this visit:  No orders of the defined types were placed in this encounter.     Return for f/u 3-4 months, NPDR OU, DFE, OCT.  There are no Patient Instructions on file for this visit.  Explained the diagnoses, plan, and follow up with the patient and they expressed understanding.  Patient expressed understanding of the importance of proper follow up care.     This document serves as a record of services personally performed by Gardiner Sleeper, MD, PhD. It was created on their behalf by Joetta Manners COT, an ophthalmic technician. The creation of this record is the provider's dictation and/or activities during the visit.    Electronically signed by: Joetta Manners COT 02/08/202412:31 PM  This document serves as a record of services personally performed by Gardiner Sleeper, MD, PhD. It was created on their behalf by San Jetty. Owens Shark, OA an ophthalmic technician. The creation of this record is the provider's dictation and/or activities during the visit.    Electronically signed by: San Jetty. Owens Shark, OA @TO$  12:31 PM  Gardiner Sleeper, M.D., Ph.D. Diseases & Surgery of the Retina and [place] 11/17/2022   I have reviewed the above documentation for accuracy and completeness, and I agree with the above. Gardiner Sleeper, M.D., Ph.D. 11/17/22 12:43 PM  Abbreviations: M myopia (nearsighted); A astigmatism; H hyperopia (farsighted); P presbyopia; Mrx spectacle prescription;  CTL contact lenses; OD right eye; OS left eye; OU both eyes  XT exotropia; ET esotropia; PEK punctate epithelial keratitis; PEE punctate epithelial erosions; DES dry eye syndrome; MGD meibomian gland dysfunction; ATs artificial tears; PFAT's preservative free artificial tears; [place] nuclear sclerotic cataract; PSC posterior subcapsular cataract; ERM epi-retinal membrane; PVD  posterior vitreous detachment; RD retinal detachment; DM diabetes mellitus; DR diabetic retinopathy; NPDR non-proliferative diabetic retinopathy; PDR proliferative diabetic retinopathy; CSME clinically significant macular edema; DME diabetic macular edema; dbh dot blot hemorrhages; CWS cotton wool spot; POAG primary open angle glaucoma; C/D cup-to-disc ratio; HVF humphrey visual field; GVF goldmann visual field; OCT optical coherence tomography; IOP intraocular pressure; BRVO Branch retinal vein occlusion; CRVO central retinal vein occlusion; CRAO central retinal artery occlusion; BRAO branch retinal artery occlusion; RT retinal tear; SB scleral buckle; PPV pars plana vitrectomy; VH Vitreous hemorrhage; PRP panretinal laser photocoagulation; IVK intravitreal kenalog; VMT vitreomacular traction; MH Macular hole;  NVD neovascularization of the disc; NVE neovascularization elsewhere; AREDS age related eye disease study; ARMD age related macular degeneration; POAG primary open angle glaucoma; EBMD epithelial/anterior basement membrane dystrophy; ACIOL anterior chamber intraocular lens; IOL intraocular lens; PCIOL posterior chamber intraocular lens; Phaco/IOL phacoemulsification with intraocular lens placement; [place] photorefractive keratectomy; LASIK laser assisted in situ keratomileusis; HTN hypertension; DM diabetes mellitus; COPD chronic obstructive pulmonary disease

## 2022-11-17 ENCOUNTER — Ambulatory Visit (INDEPENDENT_AMBULATORY_CARE_PROVIDER_SITE_OTHER): Payer: BC Managed Care – PPO | Admitting: Ophthalmology

## 2022-11-17 ENCOUNTER — Encounter (INDEPENDENT_AMBULATORY_CARE_PROVIDER_SITE_OTHER): Payer: Self-pay | Admitting: Ophthalmology

## 2022-11-17 DIAGNOSIS — H25813 Combined forms of age-related cataract, bilateral: Secondary | ICD-10-CM

## 2022-11-17 DIAGNOSIS — I10 Essential (primary) hypertension: Secondary | ICD-10-CM

## 2022-11-17 DIAGNOSIS — E113513 Type 2 diabetes mellitus with proliferative diabetic retinopathy with macular edema, bilateral: Secondary | ICD-10-CM | POA: Diagnosis not present

## 2022-11-17 DIAGNOSIS — H35033 Hypertensive retinopathy, bilateral: Secondary | ICD-10-CM | POA: Diagnosis not present

## 2022-11-17 DIAGNOSIS — H4312 Vitreous hemorrhage, left eye: Secondary | ICD-10-CM

## 2022-11-17 DIAGNOSIS — Z961 Presence of intraocular lens: Secondary | ICD-10-CM

## 2022-11-17 DIAGNOSIS — H4311 Vitreous hemorrhage, right eye: Secondary | ICD-10-CM

## 2022-11-17 DIAGNOSIS — H3581 Retinal edema: Secondary | ICD-10-CM

## 2022-11-17 DIAGNOSIS — H4313 Vitreous hemorrhage, bilateral: Secondary | ICD-10-CM | POA: Diagnosis not present

## 2022-11-17 DIAGNOSIS — E113593 Type 2 diabetes mellitus with proliferative diabetic retinopathy without macular edema, bilateral: Secondary | ICD-10-CM

## 2022-12-26 ENCOUNTER — Ambulatory Visit
Admission: RE | Admit: 2022-12-26 | Discharge: 2022-12-26 | Disposition: A | Discharge status: Home / Self Care | Payer: BC Managed Care – PPO | Source: Ambulatory Visit | Attending: Internal Medicine | Admitting: Internal Medicine

## 2022-12-26 DIAGNOSIS — Z1231 Encounter for screening mammogram for malignant neoplasm of breast: Secondary | ICD-10-CM | POA: Diagnosis not present

## 2023-01-19 DIAGNOSIS — K648 Other hemorrhoids: Secondary | ICD-10-CM | POA: Diagnosis not present

## 2023-01-19 DIAGNOSIS — K635 Polyp of colon: Secondary | ICD-10-CM | POA: Diagnosis not present

## 2023-01-19 DIAGNOSIS — K5730 Diverticulosis of large intestine without perforation or abscess without bleeding: Secondary | ICD-10-CM | POA: Diagnosis not present

## 2023-01-19 DIAGNOSIS — Z1211 Encounter for screening for malignant neoplasm of colon: Secondary | ICD-10-CM | POA: Diagnosis not present

## 2023-01-29 DIAGNOSIS — H40053 Ocular hypertension, bilateral: Secondary | ICD-10-CM | POA: Diagnosis not present

## 2023-01-29 DIAGNOSIS — H26492 Other secondary cataract, left eye: Secondary | ICD-10-CM | POA: Diagnosis not present

## 2023-01-29 DIAGNOSIS — Z961 Presence of intraocular lens: Secondary | ICD-10-CM | POA: Diagnosis not present

## 2023-02-16 DIAGNOSIS — I10 Essential (primary) hypertension: Secondary | ICD-10-CM | POA: Diagnosis not present

## 2023-02-16 DIAGNOSIS — E1165 Type 2 diabetes mellitus with hyperglycemia: Secondary | ICD-10-CM | POA: Diagnosis not present

## 2023-02-16 DIAGNOSIS — E1142 Type 2 diabetes mellitus with diabetic polyneuropathy: Secondary | ICD-10-CM | POA: Diagnosis not present

## 2023-03-30 ENCOUNTER — Encounter (INDEPENDENT_AMBULATORY_CARE_PROVIDER_SITE_OTHER): Payer: BC Managed Care – PPO | Admitting: Ophthalmology

## 2023-03-30 DIAGNOSIS — H4311 Vitreous hemorrhage, right eye: Secondary | ICD-10-CM

## 2023-03-30 DIAGNOSIS — E113513 Type 2 diabetes mellitus with proliferative diabetic retinopathy with macular edema, bilateral: Secondary | ICD-10-CM

## 2023-03-30 DIAGNOSIS — H35033 Hypertensive retinopathy, bilateral: Secondary | ICD-10-CM

## 2023-03-30 DIAGNOSIS — H4312 Vitreous hemorrhage, left eye: Secondary | ICD-10-CM

## 2023-03-30 DIAGNOSIS — Z961 Presence of intraocular lens: Secondary | ICD-10-CM

## 2023-03-30 DIAGNOSIS — I10 Essential (primary) hypertension: Secondary | ICD-10-CM

## 2023-06-25 DIAGNOSIS — I10 Essential (primary) hypertension: Secondary | ICD-10-CM | POA: Diagnosis not present

## 2023-06-25 DIAGNOSIS — E782 Mixed hyperlipidemia: Secondary | ICD-10-CM | POA: Diagnosis not present

## 2023-06-25 DIAGNOSIS — E1165 Type 2 diabetes mellitus with hyperglycemia: Secondary | ICD-10-CM | POA: Diagnosis not present

## 2023-06-29 DIAGNOSIS — Z0001 Encounter for general adult medical examination with abnormal findings: Secondary | ICD-10-CM | POA: Diagnosis not present

## 2023-06-29 DIAGNOSIS — Z23 Encounter for immunization: Secondary | ICD-10-CM | POA: Diagnosis not present

## 2023-06-29 DIAGNOSIS — F32A Depression, unspecified: Secondary | ICD-10-CM | POA: Diagnosis not present

## 2023-06-29 DIAGNOSIS — Z1231 Encounter for screening mammogram for malignant neoplasm of breast: Secondary | ICD-10-CM | POA: Diagnosis not present

## 2023-06-29 DIAGNOSIS — Z1211 Encounter for screening for malignant neoplasm of colon: Secondary | ICD-10-CM | POA: Diagnosis not present

## 2023-08-08 DIAGNOSIS — H35373 Puckering of macula, bilateral: Secondary | ICD-10-CM | POA: Diagnosis not present

## 2023-08-08 DIAGNOSIS — E113393 Type 2 diabetes mellitus with moderate nonproliferative diabetic retinopathy without macular edema, bilateral: Secondary | ICD-10-CM | POA: Diagnosis not present

## 2023-08-08 DIAGNOSIS — H40053 Ocular hypertension, bilateral: Secondary | ICD-10-CM | POA: Diagnosis not present

## 2023-08-08 DIAGNOSIS — H35352 Cystoid macular degeneration, left eye: Secondary | ICD-10-CM | POA: Diagnosis not present

## 2023-08-08 DIAGNOSIS — Z961 Presence of intraocular lens: Secondary | ICD-10-CM | POA: Diagnosis not present

## 2023-08-16 DIAGNOSIS — H26492 Other secondary cataract, left eye: Secondary | ICD-10-CM | POA: Diagnosis not present

## 2023-09-13 ENCOUNTER — Ambulatory Visit (INDEPENDENT_AMBULATORY_CARE_PROVIDER_SITE_OTHER): Payer: BC Managed Care – PPO | Admitting: Podiatry

## 2023-09-13 ENCOUNTER — Ambulatory Visit (INDEPENDENT_AMBULATORY_CARE_PROVIDER_SITE_OTHER): Payer: BC Managed Care – PPO

## 2023-09-13 DIAGNOSIS — M7741 Metatarsalgia, right foot: Secondary | ICD-10-CM | POA: Diagnosis not present

## 2023-09-13 DIAGNOSIS — M778 Other enthesopathies, not elsewhere classified: Secondary | ICD-10-CM | POA: Diagnosis not present

## 2023-09-13 DIAGNOSIS — M79675 Pain in left toe(s): Secondary | ICD-10-CM

## 2023-09-13 DIAGNOSIS — B351 Tinea unguium: Secondary | ICD-10-CM

## 2023-09-13 DIAGNOSIS — M79674 Pain in right toe(s): Secondary | ICD-10-CM

## 2023-09-13 DIAGNOSIS — M2041 Other hammer toe(s) (acquired), right foot: Secondary | ICD-10-CM

## 2023-09-13 DIAGNOSIS — M2042 Other hammer toe(s) (acquired), left foot: Secondary | ICD-10-CM

## 2023-09-13 DIAGNOSIS — M7742 Metatarsalgia, left foot: Secondary | ICD-10-CM

## 2023-09-13 NOTE — Progress Notes (Signed)
Subjective:   Patient ID: Jenny Cook, female   DOB: 62 y.o.   MRN: [id number]   HPI Chief Complaint  Patient presents with   Foot Pain    RM#12 Bilateral foot pain X 10+ years/nail trim   62 year old female presents the office the above concerns.  She gets pain to her right foot submetatarsal 5 that she points to for last 2 weeks without any injury that she reports.  She states that she does wear composite shoes that she works on concrete surfaces which makes her symptoms worse.  She works for a [employer] techn[employer] in [place].  She has had numbness and tingling to her feet "forever" and she is on gabapentin for this.  Her sugar is "up and down" and thinks her current A1c is 7.9.  On the left side it is more of her toenails are causing issues.  No swelling redness or drainage the toenail sites.  Review of Systems  All other systems reviewed and are negative.  Past Medical History:  Diagnosis Date   Cataract    Combined form OD   Diabetes mellitus    Diabetic retinopathy ([facility])    PDR OU   Hypertension    Hypertensive retinopathy    OU    Past Surgical History:  Procedure Laterality Date   CATARACT EXTRACTION Left 05/2019   Dr. Ernesto Rutherford   EYE SURGERY Left    Cat Sx - Dr. Hortense Ramal   TUBAL LIGATION       Current Outpatient Medications:    acetaminophen (TYLENOL) 325 MG tablet, Take 650 mg by mouth every 6 (six) hours as needed., Disp: , Rfl:    amLODipine (NORVASC) 10 MG tablet, Take 10 mg by mouth every morning., Disp: , Rfl:    atorvastatin (LIPITOR) 20 MG tablet, , Disp: , Rfl:    Dulaglutide (TRULICITY) 3 MG/0.5ML SOPN, , Disp: , Rfl:    hydrOXYzine (VISTARIL) 25 MG capsule, hydroxyzine pamoate 25 mg capsule, Disp: , Rfl:    Influenza vac split quadrivalent PF (FLUARIX QUADRIVALENT) 0.5 ML injection, , Disp: , Rfl:    insulin NPH-regular Human (NOVOLIN 70/30) (70-30) 100 UNIT/ML injection, Novolin 70/30 U-100 Insulin  take 50u am/60u pm, Disp: , Rfl:    insulin NPH-regular  Human (NOVOLIN 70/30) (70-30) 100 UNIT/ML injection, Novolin 70/30 U-100 Insulin  take 50u am/60u pm, Disp: , Rfl:    irbesartan-hydrochlorothiazide (AVALIDE) 150-12.5 MG tablet, , Disp: , Rfl:    irbesartan-hydrochlorothiazide (AVALIDE) 300-12.5 MG tablet, Take by mouth as directed., Disp: , Rfl:    ketoconazole (NIZORAL) 2 % cream, , Disp: , Rfl:    latanoprost (XALATAN) 0.005 % ophthalmic solution, Place 1 drop into both eyes at bedtime., Disp: , Rfl:    Latanoprost 0.005 % EMUL, latanoprost 0.005 % eye drops, Disp: , Rfl:    LATANOPROST OP, latanoprost 0.005 % eye drops, Disp: , Rfl:    NOVOLOG MIX 70/30 (70-30) 100 UNIT/ML injection, , Disp: , Rfl:    POLYETHYLENE GLYCOL 3350-GRX PO, , Disp: , Rfl:   Current Facility-Administered Medications:    Bevacizumab (AVASTIN) SOLN 1.25 mg, 1.25 mg, Intravitreal, , Rennis Chris, MD, 1.25 mg at 02/18/18 0843   Bevacizumab (AVASTIN) SOLN 1.25 mg, 1.25 mg, Intravitreal, , Rennis Chris, MD, 1.25 mg at 03/20/18 2245   Bevacizumab (AVASTIN) SOLN 1.25 mg, 1.25 mg, Intravitreal, , Rennis Chris, MD, 1.25 mg at 06/21/18 1213   Bevacizumab (AVASTIN) SOLN 1.25 mg, 1.25 mg, Intravitreal, , Rennis Chris, MD, 1.25 mg  at 07/22/18 2306   Bevacizumab (AVASTIN) SOLN 1.25 mg, 1.25 mg, Intravitreal, , Rennis Chris, MD, 1.25 mg at 07/22/18 2306  Allergies  Allergen Reactions   Other           Objective:  Physical Exam  General: AAO x3, NAD  Dermatological: Nails are hypertrophic, dystrophic, brittle, discolored, elongated 10. No surrounding redness or drainage. Tenderness nails 1-5 bilaterally. No open lesions or pre-ulcerative lesions are identified today.  Vascular: Dorsalis Pedis artery and Posterior Tibial artery pedal pulses are 2/4 bilateral with immedate capillary fill time. There is no pain with calf compression, swelling, warmth, erythema.   Neruologic: Grossly intact via light touch bilateral  Musculoskeletal: Tenderness on the right foot  submetatarsal 5.  There is no area pinpoint tenderness identified.  Hammertoes are present.  Prominence of metatarsal heads plantarly.  No area pinpoint tenderness otherwise.      Assessment:   Metatarsalgia, symptomatic onychomycosis     Plan:  -Treatment options discussed including all alternatives, risks, and complications -Etiology of symptoms were discussed -X-rays were obtained and reviewed with the patient.  Multiple views bilateral feet were obtained.  Decreased calcaneal clinician angle.  Heel spurs are noted.  Hammertoe contractures present. -Sharply debrided nails to any complications or bleeding.  Discussed oral, topical as well as alternative treatments for nail fungus.  We discussed likely Lamisil given thickening I do not think topicals will be much success for her.  She will consider her options and let me know if she wants to proceed with this. -Regards to her foot pain we discussed orthotics.  Will check in this for her.  Dispensed a metatarsal pad. -Toe cap for the hammertoes    Vivi Barrack DPM

## 2023-09-13 NOTE — Patient Instructions (Signed)
Terbinafine Tablets- this is the medication that we would try for the nails if you wanted to. I would need to check blood work prior to starting it What is this medication? TERBINAFINE (TER bin a feen) treats fungal infections of the nails. It belongs to a group of medications called antifungals. It will not treat infections caused by bacteria or viruses. This medicine may be used for other purposes; ask your health care provider or pharmacist if you have questions. COMMON BRAND NAME(S): Lamisil, Terbinex What should I tell my care team before I take this medication? They need to know if you have any of these conditions: Liver disease An unusual or allergic reaction to terbinafine, other medications, foods, dyes, or preservatives Pregnant or trying to get pregnant Breast-feeding How should I use this medication? Take this medication by mouth with water. Take it as directed on the prescription label at the same time every day. You can take it with or without food. If it upsets your stomach, take it with food. Keep taking it unless your care team tells you to stop. A special MedGuide will be given to you by the pharmacist with each prescription and refill. Be sure to read this information carefully each time. Talk to your care team regarding the use of this medication in children. Special care may be needed. Overdosage: If you think you have taken too much of this medicine contact a poison control center or emergency room at once. NOTE: This medicine is only for you. Do not share this medicine with others. What if I miss a dose? If you miss a dose, take it as soon as you can unless it is more than 4 hours late. If it is more than 4 hours late, skip the missed dose. Take the next dose at the normal time. What may interact with this medication? Do not take this medication with any of the following: Pimozide Thioridazine This medication may also interact with the following: Beta  blockers Caffeine Certain medications for mental health conditions Cimetidine Cyclosporine Medications for fungal infections like fluconazole and ketoconazole Medications for irregular heartbeat like amiodarone, flecainide and propafenone Rifampin Warfarin This list may not describe all possible interactions. Give your health care provider a list of all the medicines, herbs, non-prescription drugs, or dietary supplements you use. Also tell them if you smoke, drink alcohol, or use illegal drugs. Some items may interact with your medicine. What should I watch for while using this medication? Visit your care team for regular checks on your progress. You may need blood work while you are taking this medication. It may be some time before you see the benefit from this medication. This medication may cause serious skin reactions. They can happen weeks to months after starting the medication. Contact your care team right away if you notice fevers or flu-like symptoms with a rash. The rash may be red or purple and then turn into blisters or peeling of the skin. Or, you might notice a red rash with swelling of the face, lips or lymph nodes in your neck or under your arms. This medication can make you more sensitive to the sun. Keep out of the sun, If you cannot avoid being in the sun, wear protective clothing and sunscreen. Do not use sun lamps or tanning beds/booths. What side effects may I notice from receiving this medication? Side effects that you should report to your care team as soon as possible: Allergic reactions--skin rash, itching, hives, swelling of the face, lips,  tongue, or throat Change in sense of smell Change in taste Infection--fever, chills, cough, or sore throat Liver injury--right upper belly pain, loss of appetite, nausea, light-colored stool, dark yellow or brown urine, yellowing skin or eyes, unusual weakness or fatigue Low red blood cell level--unusual weakness or fatigue,  dizziness, headache, trouble breathing Lupus-like syndrome--joint pain, swelling, or stiffness, butterfly-shaped rash on the face, rashes that get worse in the sun, fever, unusual weakness or fatigue Rash, fever, and swollen lymph nodes Redness, blistering, peeling, or loosening of the skin, including inside the mouth Unusual bruising or bleeding Worsening mood, feelings of depression Side effects that usually do not require medical attention (report to your care team if they continue or are bothersome): Diarrhea Gas Headache Nausea Stomach pain Upset stomach This list may not describe all possible side effects. Call your doctor for medical advice about side effects. You may report side effects to FDA at 1-800-FDA-1088. Where should I keep my medication? Keep out of the reach of children and pets. Store between 20 and 25 degrees C (68 and 77 degrees F). Protect from light. Get rid of any unused medication after the expiration date. To get rid of medications that are no longer needed or have expired: Take the medication to a medication take-back program. Check with your pharmacy or law enforcement to find a location. If you cannot return the medication, check the label or package insert to see if the medication should be thrown out in the garbage or flushed down the toilet. If you are not sure, ask your care team. If it is safe to put it in the trash, take the medication out of the container. Mix the medication with cat litter, dirt, coffee grounds, or other unwanted substance. Seal the mixture in a bag or container. Put it in the trash. NOTE: This sheet is a summary. It may not cover all possible information. If you have questions about this medicine, talk to your doctor, pharmacist, or health care provider.  2024 Elsevier/Gold Standard (2021-05-11 00:00:00)

## 2023-09-19 ENCOUNTER — Telehealth: Payer: Self-pay | Admitting: Podiatry

## 2023-09-19 NOTE — Telephone Encounter (Signed)
Left message for pt to call to schedule an appt to see Nicki Gu[place] for orthotics.

## 2023-09-19 NOTE — Telephone Encounter (Signed)
-----   Message from Jenny Cook sent at 09/17/2023  7:35 AM EST ----- Can you please schedule her for orthotics?

## 2023-09-28 DIAGNOSIS — E1142 Type 2 diabetes mellitus with diabetic polyneuropathy: Secondary | ICD-10-CM | POA: Diagnosis not present

## 2023-09-28 DIAGNOSIS — Z23 Encounter for immunization: Secondary | ICD-10-CM | POA: Diagnosis not present

## 2023-09-28 DIAGNOSIS — I10 Essential (primary) hypertension: Secondary | ICD-10-CM | POA: Diagnosis not present

## 2023-09-28 DIAGNOSIS — E1165 Type 2 diabetes mellitus with hyperglycemia: Secondary | ICD-10-CM | POA: Diagnosis not present

## 2023-09-28 DIAGNOSIS — E782 Mixed hyperlipidemia: Secondary | ICD-10-CM | POA: Diagnosis not present

## 2023-10-17 ENCOUNTER — Other Ambulatory Visit: Payer: BC Managed Care – PPO

## 2023-12-13 ENCOUNTER — Ambulatory Visit: Payer: BC Managed Care – PPO | Admitting: Podiatry

## 2023-12-25 DIAGNOSIS — I10 Essential (primary) hypertension: Secondary | ICD-10-CM | POA: Diagnosis not present

## 2023-12-25 DIAGNOSIS — E1165 Type 2 diabetes mellitus with hyperglycemia: Secondary | ICD-10-CM | POA: Diagnosis not present

## 2023-12-28 DIAGNOSIS — E1142 Type 2 diabetes mellitus with diabetic polyneuropathy: Secondary | ICD-10-CM | POA: Diagnosis not present

## 2023-12-28 DIAGNOSIS — E1165 Type 2 diabetes mellitus with hyperglycemia: Secondary | ICD-10-CM | POA: Diagnosis not present

## 2023-12-28 DIAGNOSIS — I10 Essential (primary) hypertension: Secondary | ICD-10-CM | POA: Diagnosis not present

## 2023-12-28 DIAGNOSIS — E782 Mixed hyperlipidemia: Secondary | ICD-10-CM | POA: Diagnosis not present

## 2024-01-04 ENCOUNTER — Other Ambulatory Visit: Payer: Self-pay | Admitting: Internal Medicine

## 2024-01-04 DIAGNOSIS — Z0000 Encounter for general adult medical examination without abnormal findings: Secondary | ICD-10-CM

## 2024-01-30 ENCOUNTER — Ambulatory Visit
Admission: RE | Admit: 2024-01-30 | Discharge: 2024-01-30 | Disposition: A | Discharge status: Home / Self Care | Source: Ambulatory Visit | Attending: Internal Medicine | Admitting: Internal Medicine

## 2024-01-30 DIAGNOSIS — Z1231 Encounter for screening mammogram for malignant neoplasm of breast: Secondary | ICD-10-CM | POA: Diagnosis not present

## 2024-01-30 DIAGNOSIS — Z0000 Encounter for general adult medical examination without abnormal findings: Secondary | ICD-10-CM

## 2024-02-28 DIAGNOSIS — H40053 Ocular hypertension, bilateral: Secondary | ICD-10-CM | POA: Diagnosis not present

## 2024-05-02 ENCOUNTER — Other Ambulatory Visit: Payer: Self-pay | Admitting: Family Medicine

## 2024-05-02 DIAGNOSIS — I10 Essential (primary) hypertension: Secondary | ICD-10-CM | POA: Diagnosis not present

## 2024-05-02 DIAGNOSIS — R2233 Localized swelling, mass and lump, upper limb, bilateral: Secondary | ICD-10-CM

## 2024-05-02 DIAGNOSIS — E1165 Type 2 diabetes mellitus with hyperglycemia: Secondary | ICD-10-CM | POA: Diagnosis not present

## 2024-05-02 DIAGNOSIS — L239 Allergic contact dermatitis, unspecified cause: Secondary | ICD-10-CM | POA: Diagnosis not present

## 2024-05-02 DIAGNOSIS — R2243 Localized swelling, mass and lump, lower limb, bilateral: Secondary | ICD-10-CM | POA: Diagnosis not present

## 2024-05-05 ENCOUNTER — Other Ambulatory Visit: Payer: Self-pay | Admitting: Family Medicine

## 2024-05-05 DIAGNOSIS — R2243 Localized swelling, mass and lump, lower limb, bilateral: Secondary | ICD-10-CM

## 2024-05-05 DIAGNOSIS — R2233 Localized swelling, mass and lump, upper limb, bilateral: Secondary | ICD-10-CM

## 2024-05-23 ENCOUNTER — Ambulatory Visit (HOSPITAL_BASED_OUTPATIENT_CLINIC_OR_DEPARTMENT_OTHER): Admission: RE | Admit: 2024-05-23 | Source: Ambulatory Visit

## 2024-07-09 DIAGNOSIS — E1165 Type 2 diabetes mellitus with hyperglycemia: Secondary | ICD-10-CM | POA: Diagnosis not present

## 2024-07-09 DIAGNOSIS — E782 Mixed hyperlipidemia: Secondary | ICD-10-CM | POA: Diagnosis not present

## 2024-07-10 DIAGNOSIS — F439 Reaction to severe stress, unspecified: Secondary | ICD-10-CM | POA: Diagnosis not present

## 2024-07-10 DIAGNOSIS — Z124 Encounter for screening for malignant neoplasm of cervix: Secondary | ICD-10-CM | POA: Diagnosis not present

## 2024-07-10 DIAGNOSIS — Z23 Encounter for immunization: Secondary | ICD-10-CM | POA: Diagnosis not present

## 2024-07-10 DIAGNOSIS — E1142 Type 2 diabetes mellitus with diabetic polyneuropathy: Secondary | ICD-10-CM | POA: Diagnosis not present

## 2024-07-10 DIAGNOSIS — E1165 Type 2 diabetes mellitus with hyperglycemia: Secondary | ICD-10-CM | POA: Diagnosis not present

## 2024-07-10 DIAGNOSIS — Z0001 Encounter for general adult medical examination with abnormal findings: Secondary | ICD-10-CM | POA: Diagnosis not present

## 2024-07-10 DIAGNOSIS — Z1231 Encounter for screening mammogram for malignant neoplasm of breast: Secondary | ICD-10-CM | POA: Diagnosis not present

## 2024-07-10 DIAGNOSIS — Z6841 Body Mass Index (BMI) 40.0 and over, adult: Secondary | ICD-10-CM | POA: Diagnosis not present

## 2024-08-21 DIAGNOSIS — H35352 Cystoid macular degeneration, left eye: Secondary | ICD-10-CM | POA: Diagnosis not present

## 2024-08-21 DIAGNOSIS — E113391 Type 2 diabetes mellitus with moderate nonproliferative diabetic retinopathy without macular edema, right eye: Secondary | ICD-10-CM | POA: Diagnosis not present

## 2024-08-21 DIAGNOSIS — E113312 Type 2 diabetes mellitus with moderate nonproliferative diabetic retinopathy with macular edema, left eye: Secondary | ICD-10-CM | POA: Diagnosis not present

## 2024-08-21 DIAGNOSIS — H40053 Ocular hypertension, bilateral: Secondary | ICD-10-CM | POA: Diagnosis not present

## 2024-08-21 DIAGNOSIS — H35373 Puckering of macula, bilateral: Secondary | ICD-10-CM | POA: Diagnosis not present
